# Patient Record
Sex: Female | Born: 1970 | Hispanic: No | Marital: Single | Smoking: Current every day smoker
Health system: Southern US, Community
[De-identification: ages and names within clinical notes are randomized; demographics above are authoritative.]

## PROBLEM LIST (undated history)

## (undated) DIAGNOSIS — F32A Depression, unspecified: Secondary | ICD-10-CM

## (undated) DIAGNOSIS — D259 Leiomyoma of uterus, unspecified: Secondary | ICD-10-CM

## (undated) DIAGNOSIS — F329 Major depressive disorder, single episode, unspecified: Secondary | ICD-10-CM

## (undated) DIAGNOSIS — F419 Anxiety disorder, unspecified: Secondary | ICD-10-CM

## (undated) HISTORY — PX: TUBAL LIGATION: SHX77

---
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
## 2004-08-27 ENCOUNTER — Emergency Department: Payer: Self-pay | Admitting: Emergency Medicine

## 2004-09-19 ENCOUNTER — Emergency Department: Payer: Self-pay | Admitting: Emergency Medicine

## 2004-11-12 ENCOUNTER — Emergency Department: Payer: Self-pay | Admitting: Unknown Physician Specialty

## 2005-02-20 ENCOUNTER — Emergency Department: Payer: Self-pay | Admitting: Emergency Medicine

## 2005-02-20 ENCOUNTER — Other Ambulatory Visit: Payer: Self-pay

## 2005-04-03 ENCOUNTER — Emergency Department: Payer: Self-pay | Admitting: Internal Medicine

## 2006-01-14 ENCOUNTER — Emergency Department: Payer: Self-pay | Admitting: Emergency Medicine

## 2006-05-24 ENCOUNTER — Emergency Department: Payer: Self-pay | Admitting: Emergency Medicine

## 2006-05-30 ENCOUNTER — Emergency Department: Payer: Self-pay | Admitting: Emergency Medicine

## 2006-11-20 ENCOUNTER — Emergency Department: Payer: Self-pay | Admitting: Emergency Medicine

## 2006-11-27 ENCOUNTER — Emergency Department: Payer: Self-pay | Admitting: Emergency Medicine

## 2007-05-17 ENCOUNTER — Emergency Department: Payer: Self-pay | Admitting: Emergency Medicine

## 2007-08-25 ENCOUNTER — Emergency Department: Payer: Self-pay | Admitting: Internal Medicine

## 2008-02-12 ENCOUNTER — Emergency Department: Payer: Self-pay | Admitting: Emergency Medicine

## 2008-04-19 ENCOUNTER — Emergency Department: Payer: Self-pay | Admitting: Emergency Medicine

## 2008-06-25 ENCOUNTER — Emergency Department: Payer: Self-pay | Admitting: Emergency Medicine

## 2008-09-21 ENCOUNTER — Emergency Department: Payer: Self-pay | Admitting: Emergency Medicine

## 2009-06-24 ENCOUNTER — Emergency Department: Payer: Self-pay | Admitting: Emergency Medicine

## 2009-08-16 ENCOUNTER — Emergency Department: Payer: Self-pay | Admitting: Unknown Physician Specialty

## 2010-04-03 ENCOUNTER — Emergency Department: Payer: Self-pay | Admitting: Emergency Medicine

## 2010-04-08 ENCOUNTER — Emergency Department: Payer: Self-pay | Admitting: Internal Medicine

## 2010-04-24 ENCOUNTER — Emergency Department: Payer: Self-pay | Admitting: Emergency Medicine

## 2011-07-17 ENCOUNTER — Emergency Department: Payer: Self-pay | Admitting: Emergency Medicine

## 2011-11-17 ENCOUNTER — Emergency Department: Payer: Self-pay | Admitting: Emergency Medicine

## 2014-01-11 ENCOUNTER — Emergency Department: Payer: Self-pay | Admitting: Emergency Medicine

## 2014-07-23 ENCOUNTER — Emergency Department: Payer: Self-pay | Admitting: Internal Medicine

## 2014-07-25 ENCOUNTER — Emergency Department: Payer: Self-pay | Admitting: Emergency Medicine

## 2014-07-25 LAB — DIFFERENTIAL
BASOS ABS: 0.1 10*3/uL (ref 0.0–0.1)
Basophil %: 0.5 %
Eosinophil #: 0 10*3/uL (ref 0.0–0.7)
Eosinophil %: 0.1 %
LYMPHS PCT: 13.7 %
Lymphocyte #: 2.4 10*3/uL (ref 1.0–3.6)
Monocyte #: 0.5 x10 3/mm (ref 0.2–0.9)
Monocyte %: 2.7 %
Neutrophil #: 14.6 10*3/uL — ABNORMAL HIGH (ref 1.4–6.5)
Neutrophil %: 83 %

## 2014-07-25 LAB — COMPREHENSIVE METABOLIC PANEL
ALBUMIN: 3.7 g/dL (ref 3.4–5.0)
ALK PHOS: 126 U/L — AB
ALT: 29 U/L
Anion Gap: 6 — ABNORMAL LOW (ref 7–16)
BILIRUBIN TOTAL: 0.2 mg/dL (ref 0.2–1.0)
BUN: 13 mg/dL (ref 7–18)
CREATININE: 0.87 mg/dL (ref 0.60–1.30)
Calcium, Total: 8.8 mg/dL (ref 8.5–10.1)
Chloride: 103 mmol/L (ref 98–107)
Co2: 27 mmol/L (ref 21–32)
EGFR (African American): >60
Glucose: 114 mg/dL — ABNORMAL HIGH (ref 65–99)
OSMOLALITY: 273 (ref 275–301)
Potassium: 3.7 mmol/L (ref 3.5–5.1)
SGOT(AST): 13 U/L — ABNORMAL LOW (ref 15–37)
SODIUM: 136 mmol/L (ref 136–145)
TOTAL PROTEIN: 7.7 g/dL (ref 6.4–8.2)

## 2014-07-25 LAB — CBC
HCT: 38.5 % (ref 35.0–47.0)
HGB: 12.3 g/dL (ref 12.0–16.0)
MCH: 25 pg — ABNORMAL LOW (ref 26.0–34.0)
MCHC: 31.9 g/dL — ABNORMAL LOW (ref 32.0–36.0)
MCV: 79 fL — ABNORMAL LOW (ref 80–100)
Platelet: 407 10*3/uL (ref 150–440)
RBC: 4.9 10*6/uL (ref 3.80–5.20)
RDW: 16.8 % — ABNORMAL HIGH (ref 11.5–14.5)
WBC: 17.5 10*3/uL — AB (ref 3.6–11.0)

## 2014-07-25 LAB — URINALYSIS, COMPLETE
BILIRUBIN, UR: NEGATIVE
BLOOD: NEGATIVE
Bacteria: NONE SEEN
Glucose,UR: NEGATIVE mg/dL (ref 0–75)
Ketone: NEGATIVE
Leukocyte Esterase: NEGATIVE
NITRITE: NEGATIVE
PH: 6 (ref 4.5–8.0)
Protein: NEGATIVE
RBC,UR: 3 /HPF (ref 0–5)
SPECIFIC GRAVITY: 1.01 (ref 1.003–1.030)
Squamous Epithelial: 1
WBC UR: <1 /HPF (ref 0–5)

## 2014-07-25 LAB — CK: CK, Total: 74 U/L (ref 26–192)

## 2014-10-18 ENCOUNTER — Emergency Department: Payer: Self-pay | Admitting: Emergency Medicine

## 2015-01-17 ENCOUNTER — Emergency Department
Admission: EM | Admit: 2015-01-17 | Discharge: 2015-01-17 | Disposition: A | Discharge status: Home / Self Care | Payer: Self-pay | Attending: Emergency Medicine | Admitting: Emergency Medicine

## 2015-01-17 ENCOUNTER — Encounter: Payer: Self-pay | Admitting: Emergency Medicine

## 2015-01-17 DIAGNOSIS — Z720 Tobacco use: Secondary | ICD-10-CM | POA: Insufficient documentation

## 2015-01-17 DIAGNOSIS — D509 Iron deficiency anemia, unspecified: Secondary | ICD-10-CM | POA: Insufficient documentation

## 2015-01-17 DIAGNOSIS — N939 Abnormal uterine and vaginal bleeding, unspecified: Secondary | ICD-10-CM | POA: Insufficient documentation

## 2015-01-17 HISTORY — DX: Leiomyoma of uterus, unspecified: D25.9

## 2015-01-17 LAB — BASIC METABOLIC PANEL
Anion gap: 6 (ref 5–15)
BUN: 17 mg/dL (ref 6–20)
CO2: 25 mmol/L (ref 22–32)
Calcium: 8.8 mg/dL — ABNORMAL LOW (ref 8.9–10.3)
Chloride: 107 mmol/L (ref 101–111)
Creatinine, Ser: 0.85 mg/dL (ref 0.44–1.00)
GFR calc Af Amer: >60 mL/min (ref >60)
GFR calc non Af Amer: >60 mL/min (ref >60)
Glucose, Bld: 86 mg/dL (ref 65–99)
POTASSIUM: 4 mmol/L (ref 3.5–5.1)
Sodium: 138 mmol/L (ref 135–145)

## 2015-01-17 LAB — CBC
HEMATOCRIT: 36.6 % (ref 35.0–47.0)
Hemoglobin: 11.5 g/dL — ABNORMAL LOW (ref 12.0–16.0)
MCH: 23.7 pg — ABNORMAL LOW (ref 26.0–34.0)
MCHC: 31.3 g/dL — ABNORMAL LOW (ref 32.0–36.0)
MCV: 75.6 fL — ABNORMAL LOW (ref 80.0–100.0)
PLATELETS: 373 10*3/uL (ref 150–440)
RBC: 4.84 MIL/uL (ref 3.80–5.20)
RDW: 20 % — ABNORMAL HIGH (ref 11.5–14.5)
WBC: 11.5 10*3/uL — ABNORMAL HIGH (ref 3.6–11.0)

## 2015-01-17 MED ORDER — FERROUS SULFATE 325 (65 FE) MG PO TABS: 325.0000 mg | ORAL_TABLET | Freq: Every day | ORAL | Status: DC

## 2015-01-17 NOTE — ED Notes (Signed)
Pt states her period has been coming more frequent the past few months. Pt recently got a letter from [facility] that stated she was borderline anemic and may need iron pills after further examination. C/o fatigue and slight lower abd cramping but states they are "normal menstrual like cramps". Asking for lab work to check and see if she has anemia and would like an HIV test as well. Pt alert and talkative with no increased work of breathing or acute dsitress noted at this time. Skin warm and dry.

## 2015-01-17 NOTE — Discharge Instructions (Signed)
Iron Deficiency Anemia Anemia is a condition in which there are less red blood cells or hemoglobin in the blood than normal. Hemoglobin is the part of red blood cells that carries oxygen. Iron deficiency anemia is anemia caused by too little iron. It is the most common type of anemia. It may leave you tired and short of breath. CAUSES   Lack of iron in the diet.  Poor absorption of iron, as seen with intestinal disorders.  Intestinal bleeding.  Heavy periods. SIGNS AND SYMPTOMS  Mild anemia may not be noticeable. Symptoms may include:  Fatigue.  Headache.  Pale skin.  Weakness.  Tiredness.  Shortness of breath.  Dizziness.  Cold hands and feet.  Fast or irregular heartbeat. DIAGNOSIS  Diagnosis requires a thorough evaluation and physical exam by your health care provider. Blood tests are generally used to confirm iron deficiency anemia. Additional tests may be done to find the underlying cause of your anemia. These may include:  Testing for blood in the stool (fecal occult blood test).  A procedure to see inside the colon and rectum (colonoscopy).  A procedure to see inside the esophagus and stomach (endoscopy). TREATMENT  Iron deficiency anemia is treated by correcting the cause of the deficiency. Treatment may involve:  Adding iron-rich foods to your diet.  Taking iron supplements. Pregnant or breastfeeding women need to take extra iron because their normal diet usually does not provide the required amount.  Taking vitamins. Vitamin C improves the absorption of iron. Your health care provider may recommend that you take your iron tablets with a glass of orange juice or vitamin C supplement.  Medicines to make heavy menstrual flow lighter.  Surgery. HOME CARE INSTRUCTIONS   Take iron as directed by your health care provider.  If you cannot tolerate taking iron supplements by mouth, talk to your health care provider about taking them through a vein  (intravenously) or an injection into a muscle.  For the best iron absorption, iron supplements should be taken on an empty stomach. If you cannot tolerate them on an empty stomach, you may need to take them with food.  Do not drink milk or take antacids at the same time as your iron supplements. Milk and antacids may interfere with the absorption of iron.  Iron supplements can cause constipation. Make sure to include fiber in your diet to prevent constipation. A stool softener may also be recommended.  Take vitamins as directed by your health care provider.  Eat a diet rich in iron. Foods high in iron include liver, lean beef, whole-grain bread, eggs, dried fruit, and dark green leafy vegetables. SEEK IMMEDIATE MEDICAL CARE IF:   You faint. If this happens, do not drive. Call your local emergency services (911 in [place] if no other help is available.  You have chest pain.  You feel nauseous or vomit.  You have severe or increased shortness of breath with activity.  You feel weak.  You have a rapid heartbeat.  You have unexplained sweating.  You become light-headed when getting up from a chair or bed. MAKE SURE YOU:   Understand these instructions.  Will watch your condition.  Will get help right away if you are not doing well or get worse. Document Released: 07/26/2000 Document Revised: 08/03/2013 Document Reviewed: 04/05/2013 [employer] Patient Information 2015 [employer]. This information is not intended to replace advice given to you by your health care provider. Make sure you discuss any questions you have with your health care provider.  

## 2015-01-17 NOTE — ED Notes (Signed)
Patient with no complaints at this time. Respirations even and unlabored. Skin warm/dry. Discharge instructions reviewed with patient at this time. Patient given opportunity to voice concerns/ask questions. Patient discharged at this time and left Emergency Department with steady gait.   

## 2015-01-17 NOTE — ED Provider Notes (Signed)
[facility] Emergency Department Provider Note  ____________________________________________  Time seen: 9:30 PM  I have reviewed the triage vital signs and the nursing notes.   HISTORY  Chief Complaint Anemia and Vaginal Bleeding      HPI Jennifer Wiggins is a 44 y.o. female who presents with complaints of possible anemia. Patient reports she was told by her PCP that her blood counts demonstrated that she had borderline anemia and that she may need iron pills. She has a history of fibroids and has had intermittent bleeding over the last couple of years although it has not been severe. She does report some mild vaginal bleeding currently. She has T tied. She has no dizziness no weakness no fatigue. She requests hemoglobin check and iron pills     Past Medical History  Diagnosis Date  . Uterine fibroid     There are no active problems to display for this patient.   Past Surgical History  Procedure Laterality Date  . Cesarean section    . Tubal ligation      No current outpatient prescriptions on file.  Allergies Review of patient's allergies indicates no known allergies.  No family history on file.  Social History History  Substance Use Topics  . Smoking status: Current Every Day Smoker -- 0.50 packs/day    Types: Cigarettes  . Smokeless tobacco: Not on file  . Alcohol Use: No    Review of Systems  Constitutional: Negative for fever. Eyes: Negative for visual changes. ENT: Negative for sore throat Cardiovascular: Negative for chest pain. Respiratory: Negative for shortness of breath. Gastrointestinal: Negative for abdominal pain, vomiting and diarrhea. Genitourinary: Negative for dysuria. Positive for vaginal bleeding Musculoskeletal: Negative for back pain. Skin: Negative for rash. Neurological: Negative for headaches or focal weakness   10-point ROS otherwise negative.  ____________________________________________   PHYSICAL  EXAM:  VITAL SIGNS: ED Triage Vitals  Enc Vitals Group     BP 01/17/15 1948 128/80 mmHg     Pulse Rate 01/17/15 1948 64     Resp 01/17/15 1948 18     Temp 01/17/15 1948 98.1 F (36.7 C)     Temp Source 01/17/15 1948 Oral     SpO2 01/17/15 1948 100 %     Weight 01/17/15 1948 185 lb (83.915 kg)     Height 01/17/15 1948 5\' 5"  (1.651 m)     Head Cir --      Peak Flow --      Pain Score 01/17/15 1949 3     Pain Loc --      Pain Edu? --      Excl. in [place]? --      Constitutional: Alert and oriented. Well appearing and in no distress. Eyes: Conjunctivae are normal. PERRL. ENT   Head: Normocephalic and atraumatic.   Nose: No rhinnorhea.   Mouth/Throat: Mucous membranes are moist. Cardiovascular: Normal rate, regular rhythm. Normal and symmetric distal pulses are present in all extremities. No murmurs, rubs, or gallops. Respiratory: Normal respiratory effort without tachypnea nor retractions. Breath sounds are clear and equal bilaterally.  Gastrointestinal: Soft and non-tender in all quadrants. No distention. There is no CVA tenderness. Genitourinary: deferred Musculoskeletal: Nontender with normal range of motion in all extremities. No lower extremity tenderness nor edema. Neurologic:  Normal speech and language. No gross focal neurologic deficits are appreciated. Skin:  Skin is warm, dry and intact. No rash noted. Psychiatric: Mood and affect are normal. Patient exhibits appropriate insight and judgment.  ____________________________________________    LABS (pertinent positives/negatives)  Labs Reviewed  CBC - Abnormal; Notable for the following:    WBC 11.5 (*)    Hemoglobin 11.5 (*)    MCV 75.6 (*)    MCH 23.7 (*)    MCHC 31.3 (*)    RDW 20.0 (*)    All other components within normal limits  BASIC METABOLIC PANEL - Abnormal; Notable for the following:    Calcium 8.8 (*)    All other components within normal limits  URINALYSIS COMPLETEWITH MICROSCOPIC ([facility]  ONLY)  PREGNANCY, URINE    ____________________________________________   EKG  None  ____________________________________________    RADIOLOGY  None  ____________________________________________   PROCEDURES  Procedure(s) performed: none  Critical Care performed: none  ____________________________________________   INITIAL IMPRESSION / ASSESSMENT AND PLAN / ED COURSE  Pertinent labs & imaging results that were available during my care of the patient were reviewed by me and considered in my medical decision making (see chart for details).  Patient well-appearing. All vitals stable. No abdominal tenderness. Hemoglobin stable she does have a microcytic anemia that is mild. She requests iron pills I will prescribe him. Okay for discharge with PCP follow-up  ____________________________________________   FINAL CLINICAL IMPRESSION(S) / ED DIAGNOSES  Final diagnoses:  Microcytic anemia     Lavonia Drafts, MD 01/17/15 2140

## 2015-03-24 ENCOUNTER — Emergency Department
Admission: EM | Admit: 2015-03-24 | Discharge: 2015-03-24 | Disposition: A | Discharge status: Home / Self Care | Payer: Self-pay | Attending: Emergency Medicine | Admitting: Emergency Medicine

## 2015-03-24 ENCOUNTER — Encounter: Payer: Self-pay | Admitting: Emergency Medicine

## 2015-03-24 DIAGNOSIS — L237 Allergic contact dermatitis due to plants, except food: Secondary | ICD-10-CM | POA: Insufficient documentation

## 2015-03-24 DIAGNOSIS — Z720 Tobacco use: Secondary | ICD-10-CM | POA: Insufficient documentation

## 2015-03-24 DIAGNOSIS — Z79899 Other long term (current) drug therapy: Secondary | ICD-10-CM | POA: Insufficient documentation

## 2015-03-24 DIAGNOSIS — J069 Acute upper respiratory infection, unspecified: Secondary | ICD-10-CM | POA: Insufficient documentation

## 2015-03-24 DIAGNOSIS — Z793 Long term (current) use of hormonal contraceptives: Secondary | ICD-10-CM | POA: Insufficient documentation

## 2015-03-24 LAB — POCT RAPID STREP A: STREPTOCOCCUS, GROUP A SCREEN (DIRECT): NEGATIVE

## 2015-03-24 MED ORDER — TRIAMCINOLONE ACETONIDE 0.5 % EX OINT: 1.0000 "application " | TOPICAL_OINTMENT | Freq: Two times a day (BID) | CUTANEOUS | Status: DC

## 2015-03-24 NOTE — ED Notes (Signed)
Pt to ed with c/o sore throat, cough, congestion x 3 days.

## 2015-03-24 NOTE — Discharge Instructions (Signed)
Upper Respiratory Infection, Adult An upper respiratory infection (URI) is also known as the common cold. It is often caused by a type of germ (virus). Colds are easily spread (contagious). You can pass it to others by kissing, coughing, sneezing, or drinking out of the same glass. Usually, you get better in 1 or 2 weeks.  HOME CARE   Only take medicine as told by your doctor.  Use a warm mist humidifier or breathe in steam from a hot shower.  Drink enough water and fluids to keep your pee (urine) clear or pale yellow.  Get plenty of rest.  Return to work when your temperature is back to normal or as told by your doctor. You may use a face mask and wash your hands to stop your cold from spreading. GET HELP RIGHT AWAY IF:   After the first few days, you feel you are getting worse.  You have questions about your medicine.  You have chills, shortness of breath, or brown or red spit (mucus).  You have yellow or brown snot (nasal discharge) or pain in the face, especially when you bend forward.  You have a fever, puffy (swollen) neck, pain when you swallow, or white spots in the back of your throat.  You have a bad headache, ear pain, sinus pain, or chest pain.  You have a high-pitched whistling sound when you breathe in and out (wheezing).  You have a lasting cough or cough up blood.  You have sore muscles or a stiff neck. MAKE SURE YOU:   Understand these instructions.  Will watch your condition.  Will get help right away if you are not doing well or get worse. Document Released: 01/15/2008 Document Revised: 10/21/2011 Document Reviewed: 11/03/2013 [facility] Patient Information 2015 [place], [place]. This information is not intended to replace advice given to you by your health care provider. Make sure you discuss any questions you have with your health care provider.  Poison [employer] ivy is a rash caused by touching the leaves of the poison ivy plant. The rash often shows up  48 hours later. You might just have bumps, redness, and itching. Sometimes, blisters appear and break open. Your eyes may get puffy (swollen). Poison ivy often heals in 2 to 3 weeks without treatment. HOME CARE  If you touch poison ivy:  Wash your skin with soap and water right away. Wash under your fingernails. Do not rub the skin very hard.  Wash any clothes you were wearing.  Avoid poison ivy in the future. Poison ivy has 3 leaves on a stem.  Use medicine to help with itching as told by your doctor. Do not drive when you take this medicine.  Keep open sores dry, clean, and covered with a bandage and medicated cream, if needed.  Ask your doctor about medicine for children. GET HELP RIGHT AWAY IF:  You have open sores.  Redness spreads beyond the area of the rash.  There is yellowish white fluid (pus) coming from the rash.  Pain gets worse.  You have a temperature by mouth above 102 F (38.9 C), not controlled by medicine. MAKE SURE YOU:  Understand these instructions.  Will watch your condition.  Will get help right away if you are not doing well or get worse. Document Released: 08/31/2010 Document Revised: 10/21/2011 Document Reviewed: 08/31/2010 [facility] Patient Information 2015 [place], [place]. This information is not intended to replace advice given to you by your health care provider. Make sure you discuss any questions  you have with your health care provider.

## 2015-03-24 NOTE — ED Provider Notes (Signed)
[facility] Emergency Department Provider Note ____________________________________________  Time seen: Approximately 7:25 AM  I have reviewed the triage vital signs and the nursing notes.   HISTORY  Chief Complaint Sore Throat   HPI Jennifer Wiggins is a 44 y.o. female presenting to the emergency department with complaints of sore throat, cough, and congestion for 3 days. She is still able to eat and drink normally.  She states that it is more that her throat itches and is irritated more so than painful. Denies fever, body aches, N/V. She says she has been taking her "sinus medication" but did not know the name.Patient also complaining of poison ivy rash on her right forearm.  She poured bleach on it at home and it is burning and stinging now.   Past Medical History  Diagnosis Date  . Uterine fibroid     There are no active problems to display for this patient.   Past Surgical History  Procedure Laterality Date  . Cesarean section    . Tubal ligation      Current Outpatient Rx  Name  Route  Sig  Dispense  Refill  . fluticasone (FLONASE) 50 MCG/ACT nasal spray   Each Nare   Place 2 sprays into both nostrils daily.         . sertraline (ZOLOFT) 25 MG tablet   Oral   Take 25 mg by mouth daily.         . ferrous sulfate 325 (65 FE) MG tablet   Oral   Take 1 tablet (325 mg total) by mouth daily.   30 tablet   3   . triamcinolone ointment (KENALOG) 0.5 %   Topical   Apply 1 application topically 2 (two) times daily.   30 g   0     Allergies Review of patient's allergies indicates no known allergies.  History reviewed. No pertinent family history.  Social History Social History  Substance Use Topics  . Smoking status: Current Every Day Smoker -- 0.50 packs/day    Types: Cigarettes  . Smokeless tobacco: None  . Alcohol Use: No    Review of Systems Constitutional: No fever/chills Eyes: No visual changes. ENT: Positive sore  throat. Cardiovascular: Denies chest pain. Respiratory: Denies shortness of breath. Gastrointestinal: No abdominal pain.  No nausea, no vomiting.  No diarrhea.  No constipation. Genitourinary: Negative for dysuria. Musculoskeletal: Negative for back pain. Skin: Positive for poison ivy rash on right forearm. Neurological: Negative for headaches, focal weakness or numbness.  10-point ROS otherwise negative.  ____________________________________________   PHYSICAL EXAM:  VITAL SIGNS: ED Triage Vitals  Enc Vitals Group     BP 03/24/15 0712 115/68 mmHg     Pulse Rate 03/24/15 0712 65     Resp 03/24/15 0712 18     Temp 03/24/15 0712 98 F (36.7 C)     Temp Source 03/24/15 0712 Oral     SpO2 03/24/15 0712 98 %     Weight 03/24/15 0712 185 lb (83.915 kg)     Height 03/24/15 0712 5\' 5"  (1.651 m)     Head Cir --      Peak Flow --      Pain Score 03/24/15 0716 6     Pain Loc --      Pain Edu? --      Excl. in [place]? --     Constitutional: Alert and oriented. Well appearing and in no acute distress. Eyes: Conjunctivae are normal. PERRL. EOMI. Head:  Atraumatic. Nose: Mild congestion/rhinnorhea. Mouth/Throat: Mucous membranes are moist.  Oropharynx mildly erythematous.  No exudates noted. Neck: No stridor.   Hematological/Lymphatic/Immunilogical: No cervical lymphadenopathy. Cardiovascular: Normal rate, regular rhythm. Grossly normal heart sounds.  Good peripheral circulation. Respiratory: Normal respiratory effort.  No retractions. Lungs CTAB. Gastrointestinal: Soft and nontender. No distention. No abdominal bruits. No CVA tenderness. Musculoskeletal: No lower extremity tenderness nor edema.  No joint effusions. Neurologic:  Normal speech and language. No gross focal neurologic deficits are appreciated. No gait instability. Skin:  Rash noted on the right forearm. Psychiatric: Mood and affect are normal. Speech and behavior are normal.  ____________________________________________    LABS (all labs ordered are listed, but only abnormal results are displayed)  Labs Reviewed  CULTURE, GROUP A STREP ([facility] ONLY)  POCT RAPID STREP A     Rapid Strep Antigen - Negative ____________________________________________   PROCEDURES  Procedure(s) performed: None  Critical Care performed: No  ____________________________________________   INITIAL IMPRESSION / ASSESSMENT AND PLAN / ED COURSE  Pertinent labs & imaging results that were available during my care of the patient were reviewed by me and considered in my medical decision making (see chart for details).  Patient presented to the emergency room for complaint of sore throat, cough and congestion.  Rapid strep antigen negative.  Patient counseled on viral URI symptoms and supportive treatment.  Poison Ivy rash noted on the right arm and Triamcinolone cream Rx provided. ____________________________________________   FINAL CLINICAL IMPRESSION(S) / ED DIAGNOSES  Final diagnoses:  URI, acute  Poison ivy dermatitis     Arlyss Repress, PA-C 03/24/15 0818  Delman Kitten, MD 03/24/15 1553

## 2015-03-26 LAB — CULTURE, GROUP A STREP (THRC)

## 2015-05-12 ENCOUNTER — Emergency Department
Admission: EM | Admit: 2015-05-12 | Discharge: 2015-05-12 | Disposition: A | Discharge status: Home / Self Care | Payer: Self-pay | Attending: Emergency Medicine | Admitting: Emergency Medicine

## 2015-05-12 DIAGNOSIS — Z79899 Other long term (current) drug therapy: Secondary | ICD-10-CM | POA: Insufficient documentation

## 2015-05-12 DIAGNOSIS — L03116 Cellulitis of left lower limb: Secondary | ICD-10-CM | POA: Insufficient documentation

## 2015-05-12 DIAGNOSIS — Z720 Tobacco use: Secondary | ICD-10-CM | POA: Insufficient documentation

## 2015-05-12 MED ORDER — CEPHALEXIN 500 MG PO CAPS: 500.0000 mg | ORAL_CAPSULE | Freq: Two times a day (BID) | ORAL | Status: DC

## 2015-05-12 MED ORDER — CEPHALEXIN 500 MG PO CAPS
500.0000 mg | ORAL_CAPSULE | Freq: Once | ORAL | Status: AC
Start: 2015-05-12 — End: 2015-05-12
  Administered 2015-05-12: 500 mg via ORAL
  Filled 2015-05-12: qty 1

## 2015-05-12 NOTE — Discharge Instructions (Signed)

## 2015-05-12 NOTE — ED Notes (Signed)
Pt with red areas to lower legs unsure of cause.

## 2015-05-12 NOTE — ED Provider Notes (Signed)
[facility] Emergency Department Provider Note  ____________________________________________  Time seen: 2:15 a.m.  I have reviewed the triage vital signs and the nursing notes.   HISTORY  Chief Complaint Rash      HPI ARDEL JAGGER is a 44 y.o. female presents with "red areas noted on her legs times one day". Patient denies any pain no fever no nausea no vomiting. Patient does not recall any insect bites.     Past Medical History  Diagnosis Date  . Uterine fibroid     There are no active problems to display for this patient.   Past Surgical History  Procedure Laterality Date  . Cesarean section    . Tubal ligation      Current Outpatient Rx  Name  Route  Sig  Dispense  Refill  . cephALEXin (KEFLEX) 500 MG capsule   Oral   Take 1 capsule (500 mg total) by mouth 2 (two) times daily.   14 capsule   0   . ferrous sulfate 325 (65 FE) MG tablet   Oral   Take 1 tablet (325 mg total) by mouth daily.   30 tablet   3   . fluticasone (FLONASE) 50 MCG/ACT nasal spray   Each Nare   Place 2 sprays into both nostrils daily.         . sertraline (ZOLOFT) 25 MG tablet   Oral   Take 25 mg by mouth daily.         Marland Kitchen triamcinolone ointment (KENALOG) 0.5 %   Topical   Apply 1 application topically 2 (two) times daily.   30 g   0     Allergies Review of patient's allergies indicates no known allergies.  No family history on file.  Social History Social History  Substance Use Topics  . Smoking status: Current Every Day Smoker -- 0.50 packs/day    Types: Cigarettes  . Smokeless tobacco: Not on file  . Alcohol Use: No    Review of Systems  Constitutional: Negative for fever. Eyes: Negative for visual changes. ENT: Negative for sore throat. Cardiovascular: Negative for chest pain. Respiratory: Negative for shortness of breath. Gastrointestinal: Negative for abdominal pain, vomiting and diarrhea. Genitourinary: Negative for  dysuria. Musculoskeletal: Negative for back pain. Skin: Positive for rash. Neurological: Negative for headaches, focal weakness or numbness.   10-point ROS otherwise negative.  ____________________________________________   PHYSICAL EXAM:  VITAL SIGNS: ED Triage Vitals  Enc Vitals Group     BP 05/12/15 0148 112/58 mmHg     Pulse Rate 05/12/15 0148 92     Resp 05/12/15 0148 18     Temp 05/12/15 0148 98.2 F (36.8 C)     Temp Source 05/12/15 0148 Oral     SpO2 05/12/15 0148 100 %     Weight 05/12/15 0146 185 lb (83.915 kg)     Height 05/12/15 0146 5\' 5"  (1.651 m)     Head Cir --      Peak Flow --      Pain Score --      Pain Loc --      Pain Edu? --      Excl. in [place]? --     Constitutional: Alert and oriented. Well appearing and in no distress. Eyes: Conjunctivae are normal. PERRL. Normal extraocular movements. ENT   Head: Normocephalic and atraumatic.   Nose: No congestion/rhinnorhea.   Mouth/Throat: Mucous membranes are moist.   Neck: No stridor. Hematological/Lymphatic/Immunilogical: No cervical lymphadenopathy. Cardiovascular:  Normal rate, regular rhythm. Normal and symmetric distal pulses are present in all extremities. No murmurs, rubs, or gallops. Respiratory: Normal respiratory effort without tachypnea nor retractions. Breath sounds are clear and equal bilaterally. No wheezes/rales/rhonchi. Gastrointestinal: Soft and nontender. No distention. There is no CVA tenderness. Genitourinary: deferred Musculoskeletal: Nontender with normal range of motion in all extremities. No joint effusions.  No lower extremity tenderness nor edema. Neurologic:  Normal speech and language. No gross focal neurologic deficits are appreciated. Speech is normal.  Skin: Distinct area of erythema noted on the patient's left lower extremity that is blanching. Psychiatric: Mood and affect are normal. Speech and behavior are normal. Patient exhibits appropriate insight and  judgment.       INITIAL IMPRESSION / ASSESSMENT AND PLAN / ED COURSE  Pertinent labs & imaging results that were available during my care of the patient were reviewed by me and considered in my medical decision making (see chart for details).    ____________________________________________   FINAL CLINICAL IMPRESSION(S) / ED DIAGNOSES  Final diagnoses:  Cellulitis of left leg      Gregor Hams, MD 05/12/15 [phone]

## 2015-05-12 NOTE — ED Notes (Signed)
MD Brown at bedside.

## 2015-06-06 ENCOUNTER — Emergency Department
Admission: EM | Admit: 2015-06-06 | Discharge: 2015-06-06 | Disposition: A | Discharge status: Home / Self Care | Payer: Self-pay | Attending: Student | Admitting: Student

## 2015-06-06 ENCOUNTER — Encounter: Payer: Self-pay | Admitting: *Deleted

## 2015-06-06 DIAGNOSIS — H6991 Unspecified Eustachian tube disorder, right ear: Secondary | ICD-10-CM | POA: Insufficient documentation

## 2015-06-06 DIAGNOSIS — Z720 Tobacco use: Secondary | ICD-10-CM | POA: Insufficient documentation

## 2015-06-06 DIAGNOSIS — Z792 Long term (current) use of antibiotics: Secondary | ICD-10-CM | POA: Insufficient documentation

## 2015-06-06 DIAGNOSIS — Z79899 Other long term (current) drug therapy: Secondary | ICD-10-CM | POA: Insufficient documentation

## 2015-06-06 DIAGNOSIS — Z7951 Long term (current) use of inhaled steroids: Secondary | ICD-10-CM | POA: Insufficient documentation

## 2015-06-06 DIAGNOSIS — R51 Headache: Secondary | ICD-10-CM | POA: Insufficient documentation

## 2015-06-06 DIAGNOSIS — H6981 Other specified disorders of Eustachian tube, right ear: Secondary | ICD-10-CM

## 2015-06-06 MED ORDER — FEXOFENADINE-PSEUDOEPHED ER 60-120 MG PO TB12: 1.0000 | ORAL_TABLET | Freq: Two times a day (BID) | ORAL | Status: DC

## 2015-06-06 MED ORDER — NAPROXEN 500 MG PO TABS: 500.0000 mg | ORAL_TABLET | Freq: Two times a day (BID) | ORAL | Status: DC

## 2015-06-06 NOTE — ED Notes (Signed)
Pt reports right ear ache and congestion starting yesterday

## 2015-06-06 NOTE — ED Provider Notes (Signed)
[facility] Emergency Department Provider Note  ____________________________________________  Time seen: Approximately 3:12 PM  I have reviewed the triage vital signs and the nursing notes.   HISTORY  Chief Complaint Otalgia    HPI Jennifer Wiggins is a 44 y.o. female who presents with right ear pain for one day. Her right ear began to hurt yesterday while at work, and now her left ear is starting to feel full. It is a sharp, 7/10 pain, constant. Ibuprofen helps some; nothing has made it worse. She regularly takes Flonase. She uses Q tips to clean her ears. She also has a slight headache and some sneezing. She denies ear drainage, change in hearing, change in vision, feeling dizzy or lightheaded, cough, sore throat, CP, SOB.  She smokes 10 cigarettes a day and works second shift at a [employer].   Past Medical History  Diagnosis Date  . Uterine fibroid     There are no active problems to display for this patient.   Past Surgical History  Procedure Laterality Date  . Cesarean section    . Tubal ligation      Current Outpatient Rx  Name  Route  Sig  Dispense  Refill  . cephALEXin (KEFLEX) 500 MG capsule   Oral   Take 1 capsule (500 mg total) by mouth 2 (two) times daily.   14 capsule   0   . ferrous sulfate 325 (65 FE) MG tablet   Oral   Take 1 tablet (325 mg total) by mouth daily.   30 tablet   3   . fluticasone (FLONASE) 50 MCG/ACT nasal spray   Each Nare   Place 2 sprays into both nostrils daily.         . sertraline (ZOLOFT) 25 MG tablet   Oral   Take 25 mg by mouth daily.         Marland Kitchen triamcinolone ointment (KENALOG) 0.5 %   Topical   Apply 1 application topically 2 (two) times daily.   30 g   0     Allergies Review of patient's allergies indicates no known allergies.  No family history on file.  Social History Social History  Substance Use Topics  . Smoking status: Current Every Day Smoker -- 0.50 packs/day    Types:  Cigarettes  . Smokeless tobacco: None  . Alcohol Use: No    Review of Systems Constitutional: No fever/chills Eyes: No visual changes. ENT: No sore throat, no ear drainage or hearing change. Cardiovascular: Denies chest pain. Respiratory: Denies shortness of breath. Denies cough. Gastrointestinal: No abdominal pain.  No nausea, no vomiting.   Neurological: Positive for mild headache; Negative for focal weakness or numbness.  10-point ROS otherwise negative.  ____________________________________________   PHYSICAL EXAM:  VITAL SIGNS: ED Triage Vitals  Enc Vitals Group     BP 06/06/15 1446 136/59 mmHg     Pulse Rate 06/06/15 1446 73     Resp 06/06/15 1446 20     Temp 06/06/15 1446 98.2 F (36.8 C)     Temp Source 06/06/15 1446 Oral     SpO2 06/06/15 1446 98 %     Weight 06/06/15 1446 180 lb (81.647 kg)     Height 06/06/15 1446 5\' 5"  (1.651 m)     Head Cir --      Peak Flow --      Pain Score 06/06/15 1447 6     Pain Loc --      Pain Edu? --  Excl. in [place]? --     Constitutional: Alert and oriented. Well appearing and in no acute distress. Eyes: Conjunctivae are normal.  Head: Atraumatic. Ears: TM visible and gray, cone of light visible, no perforation; fluid bubbles visible behind both TMs. Nose: No congestion/rhinnorhea. Mild sinus tenderness. Mouth/Throat: Mucous membranes are moist.  Oropharynx non-erythematous. Neck: No stridor.   Hematological/Lymphatic/Immunilogical: No cervical lymphadenopathy. Cardiovascular: Normal rate, regular rhythm. Grossly normal heart sounds.  Good peripheral circulation. Respiratory: Normal respiratory effort.  No retractions. Lungs CTAB. Gastrointestinal: Soft and nontender. No distention. Bowel sounds present. Neurologic:  Normal speech and language. No gross focal neurologic deficits are appreciated. No gait instability. Skin:  Skin is warm, dry and intact. No rash noted. Psychiatric: Mood and affect are somewhat flat. Speech  and behavior are normal.  ____________________________________________   LABS (all labs ordered are listed, but only abnormal results are displayed)  Labs Reviewed - No data to display ____________________________________________  EKG  None ____________________________________________  RADIOLOGY  None ____________________________________________   PROCEDURES  Procedure(s) performed: None  Critical Care performed: No  ____________________________________________   INITIAL IMPRESSION / ASSESSMENT AND PLAN / ED COURSE  Pertinent labs & imaging results that were available during my care of the patient were reviewed by me and considered in my medical decision making (see chart for details).  Eustachian tube dysfunction. Will Rx Allegra-D and Naproxen. Have her continue her regular Flonase. Follow up with [facility] if condition persists. ____________________________________________   FINAL CLINICAL IMPRESSION(S) / ED DIAGNOSES  Final diagnoses:  Eustachian tube dysfunction, right     Sable Feil, PA-C 06/06/15 1519  Joanne Gavel, MD 06/06/15 [phone]

## 2015-12-21 ENCOUNTER — Emergency Department
Admission: EM | Admit: 2015-12-21 | Discharge: 2015-12-21 | Disposition: A | Discharge status: Home / Self Care | Payer: Self-pay | Attending: Emergency Medicine | Admitting: Emergency Medicine

## 2015-12-21 ENCOUNTER — Emergency Department: Payer: Self-pay

## 2015-12-21 ENCOUNTER — Encounter: Payer: Self-pay | Admitting: *Deleted

## 2015-12-21 DIAGNOSIS — W1839XA Other fall on same level, initial encounter: Secondary | ICD-10-CM | POA: Insufficient documentation

## 2015-12-21 DIAGNOSIS — Y929 Unspecified place or not applicable: Secondary | ICD-10-CM | POA: Insufficient documentation

## 2015-12-21 DIAGNOSIS — Y939 Activity, unspecified: Secondary | ICD-10-CM | POA: Insufficient documentation

## 2015-12-21 DIAGNOSIS — Z79899 Other long term (current) drug therapy: Secondary | ICD-10-CM | POA: Insufficient documentation

## 2015-12-21 DIAGNOSIS — Y999 Unspecified external cause status: Secondary | ICD-10-CM | POA: Insufficient documentation

## 2015-12-21 DIAGNOSIS — S5002XA Contusion of left elbow, initial encounter: Secondary | ICD-10-CM | POA: Insufficient documentation

## 2015-12-21 DIAGNOSIS — F17210 Nicotine dependence, cigarettes, uncomplicated: Secondary | ICD-10-CM | POA: Insufficient documentation

## 2015-12-21 MED ORDER — IBUPROFEN 800 MG PO TABS: 800.0000 mg | ORAL_TABLET | Freq: Three times a day (TID) | ORAL | Status: DC

## 2015-12-21 NOTE — ED Provider Notes (Signed)
[facility] Department Provider Note   ____________________________________________  Time seen: Approximately 10:44 AM  I have reviewed the triage vital signs and the nursing notes.   HISTORY  Chief Complaint Arm Injury   HPI Jennifer Wiggins is a 45 y.o. female this is a complaint of left elbow pain. Patient states that she fell last night due to her shoes being wet. She has continued to have some pain and mild swelling to her elbow. Range of motion has increased her pain. Patient has not taken any over-the-counter medication for her pain. She denies any previous fractures in this Jennifer area. She denies any head injury or loss of consciousness last night. Currently patient rates her pain as a 6/10.   Past Medical History  Diagnosis Date  . Uterine fibroid     There are no active problems to display for this patient.   Past Surgical History  Procedure Laterality Date  . Cesarean section    . Tubal ligation      Current Outpatient Rx  Name  Route  Sig  Dispense  Refill  . fluticasone (FLONASE) 50 MCG/ACT nasal spray   Each Nare   Place 2 sprays into both nostrils daily.         Marland Kitchen ibuprofen (ADVIL,MOTRIN) 800 MG tablet   Oral   Take 1 tablet (800 mg total) by mouth 3 (three) times daily.   30 tablet   0   . sertraline (ZOLOFT) 25 MG tablet   Oral   Take 25 mg by mouth daily.           Allergies Review of patient's allergies indicates no known allergies.  No family history on file.  Social History Social History  Substance Use Topics  . Smoking status: Current Every Day Smoker -- 0.50 packs/day    Types: Cigarettes  . Smokeless tobacco: None  . Alcohol Use: No    Review of Systems Constitutional: No fever/chills Eyes: No visual changes. ENT: No trauma Cardiovascular: Denies chest pain. Respiratory: Denies shortness of breath. Gastrointestinal:   No nausea, no vomiting.   Musculoskeletal: Negative for back pain.  Positive left elbow pain. Skin: Negative for rash. Neurological: Negative for headaches, focal weakness or numbness.  10-point ROS otherwise negative.  ____________________________________________   PHYSICAL EXAM:  VITAL SIGNS: ED Triage Vitals  Enc Vitals Group     BP 12/21/15 0926 112/59 mmHg     Pulse Rate 12/21/15 0926 73     Resp 12/21/15 0926 16     Temp 12/21/15 0926 98.2 F (36.8 C)     Temp Source 12/21/15 0926 Oral     SpO2 12/21/15 0926 97 %     Weight 12/21/15 0926 180 lb (81.647 kg)     Height 12/21/15 0926 5\' 4"  (1.626 m)     Head Cir --      Peak Flow --      Pain Score 12/21/15 0923 6     Pain Loc --      Pain Edu? --      Excl. in [place]? --     Constitutional: Alert and oriented. Well appearing and in no acute distress. Eyes: Conjunctivae are normal. PERRL. EOMI. Head: Atraumatic. Nose: No congestion/rhinnorhea. Neck: No stridor.  No cervical tenderness on palpation posteriorly. Cardiovascular: Normal rate, regular rhythm. Grossly normal heart sounds.  Good peripheral circulation. Respiratory: Normal respiratory effort.  No retractions. Lungs CTAB. Musculoskeletal: Examination of the left elbow there is no gross  deformity. There is some tenderness on palpation posteriorly and lateral epicondyle lateral proximal aspect. Range of motion is slow secondary to pain. Patient is able to flex and extend and rotate but slowly secondary to pain. Minimal soft tissue swelling is noted. Neurologic:  Normal speech and language. No gross focal neurologic deficits are appreciated. No gait instability. Skin:  Skin is warm, dry and intact. Minimal 2 cm area of ecchymosis but no abrasions were seen. Psychiatric: Mood and affect are normal. Speech and behavior are normal.  ____________________________________________   LABS (all labs ordered are listed, but only abnormal results are displayed)  Labs Reviewed - No data to  display ____________________________________________  EKG  Deferred ____________________________________________  RADIOLOGY  X-ray of left elbow is negative for fracture per radiologist. Leana Gamer, personally viewed and evaluated these images (plain radiographs) as part of my medical decision making, as well as reviewing the written report by the radiologist. ____________________________________________   PROCEDURES  Procedure(s) performed: None  Critical Care performed: No  ____________________________________________   INITIAL IMPRESSION / ASSESSMENT AND PLAN / ED COURSE  Pertinent labs & imaging results that were available during my care of the patient were reviewed by me and considered in my medical decision making (see chart for details).  Patient is given a prescription for ibuprofen 800 mg 3 times a day with food. She is to ice and elevate as needed for pain and swelling. She is to follow-up with Dr. Sabra Heck if any continued positive with her elbow. ____________________________________________   FINAL CLINICAL IMPRESSION(S) / ED DIAGNOSES  Final diagnoses:  Contusion, elbow, left, initial encounter      NEW MEDICATIONS STARTED DURING THIS VISIT:  Discharge Medication List as of 12/21/2015 10:54 AM    START taking these medications   Details  ibuprofen (ADVIL,MOTRIN) 800 MG tablet Take 1 tablet (800 mg total) by mouth 3 (three) times daily., Starting 12/21/2015, Until Discontinued, Print         Note:  This document was prepared using Dragon voice recognition software and may include unintentional dictation errors.    Johnn Hai, PA-C 12/21/15 1556  Daymon Larsen, MD 12/22/15 [phone]

## 2015-12-21 NOTE — ED Notes (Signed)
Pt fell last night landed on left arm, pt complains of left arm pain, no obvious deformity

## 2015-12-21 NOTE — Discharge Instructions (Signed)
Cryotherapy Cryotherapy is when you put ice on your injury. Ice helps lessen pain and puffiness (swelling) after an injury. Ice works the best when you start using it in the first 24 to 48 hours after an injury. HOME CARE  Put a dry or damp towel between the ice pack and your skin.  You may press gently on the ice pack.  Leave the ice on for no more than 10 to 20 minutes at a time.  Check your skin after 5 minutes to make sure your skin is okay.  Rest at least 20 minutes between ice pack uses.  Stop using ice when your skin loses feeling (numbness).  Do not use ice on someone who cannot tell you when it hurts. This includes small children and people with memory problems (dementia). GET HELP RIGHT AWAY IF:  You have white spots on your skin.  Your skin turns blue or pale.  Your skin feels waxy or hard.  Your puffiness gets worse. MAKE SURE YOU:   Understand these instructions.  Will watch your condition.  Will get help right away if you are not doing well or get worse.   This information is not intended to replace advice given to you by your health care provider. Make sure you discuss any questions you have with your health care provider.   Document Released: 01/15/2008 Document Revised: 10/21/2011 Document Reviewed: 03/21/2011 Elsevier Interactive Patient Education 2016 [place].  Elbow Contusion  An elbow contusion is a deep bruise of the elbow. Contusions happen when an injury causes bleeding under the skin. Signs of bruising include pain, puffiness (swelling), and discolored skin. The contusion may turn blue, purple, or yellow. HOME CARE  Put ice on the injured area.  Put ice in a plastic bag.  Place a towel between your skin and the bag.  Leave the ice on for 15-20 minutes, 03-04 times a day.  Only take medicines as told by your doctor.  Rest your elbow until the pain and puffiness are better.  Raise (elevate) your elbow to lessen puffiness.  Put on  an elastic wrap as told by your doctor. You can take it off for sleeping, showers, and baths. If your fingers get cold, blue, or lose feeling (numb), take the wrap off. Put the wrap back on more loosely.  Use your elbow only as told by your doctor. If you are asked to do elbow exercises, do them as told.  Keep all doctor visits as told. GET HELP RIGHT AWAY IF:  You have more redness, puffiness, or pain in your elbow.  Your puffiness or pain is not helped by medicines.  You have puffiness of the hand and fingers.  You are not able to move your fingers or wrist.  You start to lose feeling in your hand or fingers.  Your fingers or hand become cold or blue. MAKE SURE YOU:   Understand these instructions.  Will watch your condition.  Will get help right away if you are not doing well or get worse.   This information is not intended to replace advice given to you by your health care provider. Make sure you discuss any questions you have with your health care provider.   Document Released: 07/18/2011 Document Revised: 01/28/2012 Document Reviewed: 03/13/2015 Elsevier Interactive Patient Education 2016 [place] to your elbow as needed for pain and swelling. Begin taking ibuprofen with food as needed for inflammation and pain. [facility] with [facility] if  any continued problems with your elbow.

## 2015-12-21 NOTE — ED Notes (Signed)
States she fell last pm and landed on left elbow  Increased pain with min swelling noted to elbow  Positive pulses noted

## 2016-01-13 ENCOUNTER — Emergency Department
Admission: EM | Admit: 2016-01-13 | Discharge: 2016-01-13 | Disposition: A | Discharge status: Home / Self Care | Payer: Self-pay | Attending: Emergency Medicine | Admitting: Emergency Medicine

## 2016-01-13 ENCOUNTER — Encounter: Payer: Self-pay | Admitting: Emergency Medicine

## 2016-01-13 DIAGNOSIS — Z5321 Procedure and treatment not carried out due to patient leaving prior to being seen by health care provider: Secondary | ICD-10-CM | POA: Insufficient documentation

## 2016-01-13 DIAGNOSIS — N939 Abnormal uterine and vaginal bleeding, unspecified: Secondary | ICD-10-CM | POA: Insufficient documentation

## 2016-01-13 LAB — CBC
HEMATOCRIT: 37.2 % (ref 35.0–47.0)
Hemoglobin: 12.4 g/dL (ref 12.0–16.0)
MCH: 27.8 pg (ref 26.0–34.0)
MCHC: 33.4 g/dL (ref 32.0–36.0)
MCV: 83.1 fL (ref 80.0–100.0)
Platelets: 288 10*3/uL (ref 150–440)
RBC: 4.48 MIL/uL (ref 3.80–5.20)
RDW: 15.1 % — ABNORMAL HIGH (ref 11.5–14.5)
WBC: 13.2 10*3/uL — AB (ref 3.6–11.0)

## 2016-01-13 LAB — URINALYSIS COMPLETE WITH MICROSCOPIC (ARMC ONLY)
BACTERIA UA: NONE SEEN
Bilirubin Urine: NEGATIVE
Glucose, UA: NEGATIVE mg/dL
KETONES UR: NEGATIVE mg/dL
LEUKOCYTES UA: NEGATIVE
NITRITE: NEGATIVE
Protein, ur: NEGATIVE mg/dL
SPECIFIC GRAVITY, URINE: 1.016 (ref 1.005–1.030)
pH: 6 (ref 5.0–8.0)

## 2016-01-13 NOTE — ED Notes (Signed)
POCT RESULTS WERE NEGATIVE

## 2016-01-13 NOTE — ED Notes (Signed)
Pt updated multiple times regarding wait. Pt verbalizes understanding.

## 2016-01-13 NOTE — ED Notes (Signed)
Pt states 3 weeks of vaginal bleeding. Pt also states has had unusual vaginal odor. Pt denies pain. Pt denies fever. Pt appears in no acute distress in triage. Pt states "i feel like my cervix is low, i can feel it when i put my finger up in me."

## 2016-01-13 NOTE — ED Notes (Signed)
Pt provided with blankets for comfort.

## 2016-05-26 ENCOUNTER — Emergency Department: Payer: Self-pay

## 2016-05-26 ENCOUNTER — Encounter: Payer: Self-pay | Admitting: Urgent Care

## 2016-05-26 ENCOUNTER — Emergency Department
Admission: EM | Admit: 2016-05-26 | Discharge: 2016-05-26 | Disposition: A | Discharge status: Home / Self Care | Payer: Self-pay | Attending: Emergency Medicine | Admitting: Emergency Medicine

## 2016-05-26 DIAGNOSIS — S8012XA Contusion of left lower leg, initial encounter: Secondary | ICD-10-CM | POA: Insufficient documentation

## 2016-05-26 DIAGNOSIS — F17210 Nicotine dependence, cigarettes, uncomplicated: Secondary | ICD-10-CM | POA: Insufficient documentation

## 2016-05-26 DIAGNOSIS — Y999 Unspecified external cause status: Secondary | ICD-10-CM | POA: Insufficient documentation

## 2016-05-26 DIAGNOSIS — S0083XA Contusion of other part of head, initial encounter: Secondary | ICD-10-CM

## 2016-05-26 DIAGNOSIS — S60222A Contusion of left hand, initial encounter: Secondary | ICD-10-CM | POA: Insufficient documentation

## 2016-05-26 DIAGNOSIS — R0789 Other chest pain: Secondary | ICD-10-CM | POA: Insufficient documentation

## 2016-05-26 DIAGNOSIS — Z791 Long term (current) use of non-steroidal anti-inflammatories (NSAID): Secondary | ICD-10-CM | POA: Insufficient documentation

## 2016-05-26 DIAGNOSIS — S8011XA Contusion of right lower leg, initial encounter: Secondary | ICD-10-CM | POA: Insufficient documentation

## 2016-05-26 DIAGNOSIS — S60221A Contusion of right hand, initial encounter: Secondary | ICD-10-CM | POA: Insufficient documentation

## 2016-05-26 DIAGNOSIS — Y9389 Activity, other specified: Secondary | ICD-10-CM | POA: Insufficient documentation

## 2016-05-26 DIAGNOSIS — S022XXA Fracture of nasal bones, initial encounter for closed fracture: Secondary | ICD-10-CM | POA: Insufficient documentation

## 2016-05-26 DIAGNOSIS — Y929 Unspecified place or not applicable: Secondary | ICD-10-CM | POA: Insufficient documentation

## 2016-05-26 MED ORDER — ETODOLAC 200 MG PO CAPS: 200.0000 mg | ORAL_CAPSULE | Freq: Three times a day (TID) | ORAL | 0 refills | Status: DC
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
## 2016-05-26 NOTE — ED Notes (Signed)
RN walking patient to the room. Patient now reporting pain in her LEFT rib area. Patient reports that she was "yawning hard" while in the waiting room and began to experience pain. States, "Now how will they do the xrays? Can I get a scan from my head all the way down to my pelvic area?" RN advised patient that imaging orders would be decided by the attending EDP. Dr. Dahlia Client made aware of the changes that have occurred since patient initially checked in, and of the request that she has made for "scans".
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
## 2016-10-17 ENCOUNTER — Encounter: Payer: Self-pay | Admitting: *Deleted

## 2016-10-17 ENCOUNTER — Emergency Department
Admission: EM | Admit: 2016-10-17 | Discharge: 2016-10-17 | Disposition: A | Discharge status: Home / Self Care | Payer: Self-pay | Attending: Emergency Medicine | Admitting: Emergency Medicine

## 2016-10-17 DIAGNOSIS — N760 Acute vaginitis: Secondary | ICD-10-CM | POA: Insufficient documentation

## 2016-10-17 DIAGNOSIS — F17210 Nicotine dependence, cigarettes, uncomplicated: Secondary | ICD-10-CM | POA: Insufficient documentation

## 2016-10-17 DIAGNOSIS — Z79899 Other long term (current) drug therapy: Secondary | ICD-10-CM | POA: Insufficient documentation

## 2016-10-17 DIAGNOSIS — B9689 Other specified bacterial agents as the cause of diseases classified elsewhere: Secondary | ICD-10-CM

## 2016-10-17 LAB — CHLAMYDIA/NGC RT PCR (ARMC ONLY)
Chlamydia Tr: NOT DETECTED
N gonorrhoeae: NOT DETECTED

## 2016-10-17 LAB — WET PREP, GENITAL
SPERM: NONE SEEN
TRICH WET PREP: NONE SEEN
Yeast Wet Prep HPF POC: NONE SEEN

## 2016-10-17 LAB — URINALYSIS, ROUTINE W REFLEX MICROSCOPIC
Bilirubin Urine: NEGATIVE
Glucose, UA: NEGATIVE mg/dL
HGB URINE DIPSTICK: NEGATIVE
Ketones, ur: NEGATIVE mg/dL
Leukocytes, UA: NEGATIVE
NITRITE: NEGATIVE
PH: 5 (ref 5.0–8.0)
Protein, ur: NEGATIVE mg/dL
Specific Gravity, Urine: 1.025 (ref 1.005–1.030)

## 2016-10-17 LAB — POCT PREGNANCY, URINE: Preg Test, Ur: NEGATIVE

## 2016-10-17 MED ORDER — METRONIDAZOLE 500 MG PO TABS: 500.0000 mg | ORAL_TABLET | Freq: Two times a day (BID) | ORAL | 0 refills | Status: DC

## 2016-10-17 NOTE — ED Triage Notes (Signed)
Pt complains of a yellow vaginal discharge with an odor, pt denies any other symptoms

## 2016-10-17 NOTE — Discharge Instructions (Signed)
Take all the antibiotic until completely finished. Do not mix this antibiotic with any alcohol products including cough syrup. Follow-up with your doctor at [facility] if any continued problems.

## 2016-10-17 NOTE — ED Provider Notes (Signed)
[facility] Emergency Department Provider Note   ____________________________________________   First MD Initiated Contact with Patient 10/17/16 1133     (approximate)  I have reviewed the triage vital signs and the nursing notes.   HISTORY  Chief Complaint Vaginal Discharge    HPI Jennifer Wiggins is a 46 y.o. female is here with complaint of yellow vaginal discharge and odor. Patient states that this began the first of the week. Patient states that last week she had a "normal period".  She states after that she began having a discharge but had odor to it. She denies any pain, fever, chills, nausea or vomiting. Patient states that she was in a relationship with someone for 6 years that broke up. She does not answer the question of whether it there was protected sex or not. She rates her discomfort as 7 out of 10.   Past Medical History:  Diagnosis Date  . Uterine fibroid     There are no active problems to display for this patient.   Past Surgical History:  Procedure Laterality Date  . CESAREAN SECTION    . TUBAL LIGATION      Prior to Admission medications   Medication Sig Start Date End Date Taking? Authorizing Provider  fluticasone (FLONASE) 50 MCG/ACT nasal spray Place 2 sprays into both nostrils daily.    Historical Provider, MD  metroNIDAZOLE (FLAGYL) 500 MG tablet Take 1 tablet (500 mg total) by mouth 2 (two) times daily. 10/17/16   Johnn Hai, PA-C  sertraline (ZOLOFT) 25 MG tablet Take 25 mg by mouth daily.    Historical Provider, MD    Allergies Patient has no known allergies.  No family history on file.  Social History Social History  Substance Use Topics  . Smoking status: Current Every Day Smoker    Packs/day: 0.50    Types: Cigarettes  . Smokeless tobacco: Never Used  . Alcohol use Yes    Review of Systems Constitutional: No fever/chills Cardiovascular: Denies chest pain. Respiratory: Denies shortness of  breath. Gastrointestinal: No abdominal pain.  No nausea, no vomiting.  Genitourinary: Positive vaginal discharge. Musculoskeletal: Negative for back pain. Skin: Negative for rash. Neurological: Negative for headaches, focal weakness or numbness.  10-point ROS otherwise negative.  ____________________________________________   PHYSICAL EXAM:  VITAL SIGNS: ED Triage Vitals  Enc Vitals Group     BP 10/17/16 1024 (!) 154/81     Pulse Rate 10/17/16 1024 64     Resp 10/17/16 1024 18     Temp 10/17/16 1024 97.8 F (36.6 C)     Temp Source 10/17/16 1024 Oral     SpO2 10/17/16 1024 100 %     Weight 10/17/16 1024 160 lb (72.6 kg)     Height 10/17/16 1024 5\' 4"  (1.626 m)     Head Circumference --      Peak Flow --      Pain Score 10/17/16 1110 7     Pain Loc --      Pain Edu? --      Excl. in [place]? --     Constitutional: Alert and oriented. Well appearing and in no acute distress. Eyes: Conjunctivae are normal. PERRL. EOMI. Head: Atraumatic. Nose: No congestion/rhinnorhea. Neck: No stridor.   Cardiovascular: Normal rate, regular rhythm. Grossly normal heart sounds.  Good peripheral circulation. Respiratory: Normal respiratory effort.  No retractions. Lungs CTAB. Gastrointestinal: Soft and nontender. No distention. Bowel sounds normoactive 4 quadrants. Genitourinary: Normal external female genitalia.  There is white, clear vaginal secretions noted. Bimanual exam was negative. No masses or tenderness was noted bilaterally. Musculoskeletal: No lower extremity tenderness nor edema.  No joint effusions. Neurologic:  Normal speech and language. No gross focal neurologic deficits are appreciated. No gait instability. Skin:  Skin is warm, dry and intact. No rash noted. Psychiatric: Mood and affect are normal. Speech and behavior are normal.  ____________________________________________   LABS (all labs ordered are listed, but only abnormal results are displayed)  Labs Reviewed  WET  PREP, GENITAL - Abnormal; Notable for the following:       Result Value   Clue Cells Wet Prep HPF POC PRESENT (*)    WBC, Wet Prep HPF POC RARE (*)    All other components within normal limits  URINALYSIS, ROUTINE W REFLEX MICROSCOPIC - Abnormal; Notable for the following:    Color, Urine YELLOW (*)    APPearance CLEAR (*)    All other components within normal limits  CHLAMYDIA/NGC RT PCR ([facility] ONLY)  POC URINE PREG, ED  POCT PREGNANCY, URINE     PROCEDURES  Procedure(s) performed: None  Procedures  Critical Care performed: No  ____________________________________________   INITIAL IMPRESSION / ASSESSMENT AND PLAN / ED COURSE  Pertinent labs & imaging results that were available during my care of the patient were reviewed by me and considered in my medical decision making (see chart for details).  Wet prep was positive for clue cells. Patient was placed on Flagyl 500 mg twice a day for 7 days. Patient was given discharge instructions on this. She'll follow-up with [facility] here with any continued problems.   ____________________________________________   FINAL CLINICAL IMPRESSION(S) / ED DIAGNOSES  Final diagnoses:  Bacterial vaginosis      NEW MEDICATIONS STARTED DURING THIS VISIT:  Discharge Medication List as of 10/17/2016 12:58 PM    START taking these medications   Details  metroNIDAZOLE (FLAGYL) 500 MG tablet Take 1 tablet (500 mg total) by mouth 2 (two) times daily., Starting Thu 10/17/2016, Print         Note:  This document was prepared using Dragon voice recognition software and may include unintentional dictation errors.    Johnn Hai, PA-C 10/17/16 [place], MD 10/17/16 [phone]

## 2017-01-17 ENCOUNTER — Emergency Department: Payer: Self-pay

## 2017-01-17 ENCOUNTER — Emergency Department
Admission: EM | Admit: 2017-01-17 | Discharge: 2017-01-17 | Disposition: A | Discharge status: Home / Self Care | Payer: Self-pay | Attending: Emergency Medicine | Admitting: Emergency Medicine

## 2017-01-17 ENCOUNTER — Encounter: Payer: Self-pay | Admitting: Emergency Medicine

## 2017-01-17 DIAGNOSIS — M79672 Pain in left foot: Secondary | ICD-10-CM | POA: Insufficient documentation

## 2017-01-17 DIAGNOSIS — Z79899 Other long term (current) drug therapy: Secondary | ICD-10-CM | POA: Insufficient documentation

## 2017-01-17 DIAGNOSIS — F17210 Nicotine dependence, cigarettes, uncomplicated: Secondary | ICD-10-CM | POA: Insufficient documentation

## 2017-01-17 NOTE — ED Notes (Signed)
See triage note  states she developed swelling to left foot couple of days ago  Denies any injury  Ambulates well

## 2017-01-17 NOTE — Discharge Instructions (Signed)
Follow up with your primary care provider if any continued problems.  Ice and elevate when needed.  Wear good supportive shoes. Continue taking your meloxicam.

## 2017-01-17 NOTE — ED Provider Notes (Signed)
[facility] Emergency Department Provider Note ____________________________________________  Time seen: 7:09 AM  I have reviewed the triage vital signs and the nursing notes.  HISTORY  Chief Complaint  Foot Pain   HPI Jennifer Wiggins is a 46 y.o. female is here with complaint of left foot pain since yesterday. Patient states that it has been swollen. She denies any recent injury or injuries of the past with her foot. She has taken some over-the-counter medication without any relief of her pain. Patient finds it increased pain with weightbearing or ambulation. Currently she rates her pain is 3 out of 10.     Past Medical History:  Diagnosis Date  . Uterine fibroid     There are no active problems to display for this patient.   Past Surgical History:  Procedure Laterality Date  . CESAREAN SECTION    . TUBAL LIGATION      Prior to Admission medications   Medication Sig Start Date End Date Taking? Authorizing Provider  meloxicam (MOBIC) 15 MG tablet Take 15 mg by mouth daily.   Yes [provider]  sertraline (ZOLOFT) 25 MG tablet Take 25 mg by mouth daily.    [provider]    Allergies Patient has no known allergies.  No family history on file.  Social History Social History  Substance Use Topics  . Smoking status: Current Every Day Smoker    Packs/day: 0.50    Types: Cigarettes  . Smokeless tobacco: Never Used  . Alcohol use Yes    Review of Systems  Constitutional: Negative for fever. Cardiovascular: Negative for chest pain. Respiratory: Negative for shortness of breath. Musculoskeletal: Positive for left foot pain. Skin: Negative for rash. Neurological: Negative for focal weakness or numbness. ____________________________________________  PHYSICAL EXAM:  VITAL SIGNS: ED Triage Vitals  Enc Vitals Group     BP 01/17/17 0644 (!) 150/75     Pulse Rate 01/17/17 0644 68     Resp 01/17/17 0644 20     Temp  01/17/17 0644 98.1 F (36.7 C)     Temp Source 01/17/17 0644 Oral     SpO2 --      Weight 01/17/17 0635 180 lb (81.6 kg)     Height 01/17/17 0635 5\' 4"  (1.626 m)     Head Circumference --      Peak Flow --      Pain Score --      Pain Loc --      Pain Edu? --      Excl. in [place]? --     Constitutional: Alert and oriented. Well appearing and in no distress. Head: Normocephalic and atraumatic. Hematological/Lymphatic/Immunological: No cervical lymphadenopathy. Cardiovascular: Normal rate, regular rhythm. Normal distal pulses. Respiratory: Normal respiratory effort. No wheezes/rales/rhonchi. Musculoskeletal: Examination of the left foot there is no gross deformity noted. There is some minimal diffuse tenderness on palpation of the dorsal aspect. There is no erythema, ecchymosis or abrasions noted. Patient is able to move digits distally without any difficulty. Motor sensory function intact. Neurologic:   Normal speech and language. No gross focal neurologic deficits are appreciated. Skin:  Skin is warm, dry and intact. No rash noted. Psychiatric: Mood and affect are normal. Patient exhibits appropriate insight and judgment.   RADIOLOGY Left foot x-ray per radiologist: IMPRESSION:  Degenerative changes first MTP joint. No acute abnormality  identified .  I, Johnn Hai, personally viewed and evaluated these images (plain radiographs) as part of my medical decision making, as  well as reviewing the written report by the radiologist.    [place] / [place] / ED COURSE  Patient already has a prescription for meloxicam that she picked up the first week and is only taking 1. Patient is encouraged to continue taking her meloxicam and follow-up with her PCP at [facility] if any continued problems with her foot.    ____________________________________________  FINAL CLINICAL IMPRESSION(S) / ED DIAGNOSES  Final diagnoses:  Foot pain, left     Philomena Course 01/17/17 1717    Schaevitz, Randall An, MD 01/18/17 [phone]

## 2017-01-17 NOTE — ED Triage Notes (Signed)
Patient ambulatory to triage with steady gait, without difficulty or distress noted; pt reports having left foot swelling since yesterday with no known injury; denies any pain

## 2017-02-24 ENCOUNTER — Emergency Department
Admission: EM | Admit: 2017-02-24 | Discharge: 2017-02-24 | Disposition: A | Discharge status: Home / Self Care | Payer: Self-pay | Attending: Emergency Medicine | Admitting: Emergency Medicine

## 2017-02-24 ENCOUNTER — Encounter: Payer: Self-pay | Admitting: Emergency Medicine

## 2017-02-24 DIAGNOSIS — Z79899 Other long term (current) drug therapy: Secondary | ICD-10-CM | POA: Insufficient documentation

## 2017-02-24 DIAGNOSIS — F17210 Nicotine dependence, cigarettes, uncomplicated: Secondary | ICD-10-CM | POA: Insufficient documentation

## 2017-02-24 DIAGNOSIS — N939 Abnormal uterine and vaginal bleeding, unspecified: Secondary | ICD-10-CM | POA: Insufficient documentation

## 2017-02-24 DIAGNOSIS — R1030 Lower abdominal pain, unspecified: Secondary | ICD-10-CM | POA: Insufficient documentation

## 2017-02-24 DIAGNOSIS — B9689 Other specified bacterial agents as the cause of diseases classified elsewhere: Secondary | ICD-10-CM

## 2017-02-24 DIAGNOSIS — N760 Acute vaginitis: Secondary | ICD-10-CM | POA: Insufficient documentation

## 2017-02-24 LAB — URINALYSIS, ROUTINE W REFLEX MICROSCOPIC
Bilirubin Urine: NEGATIVE
GLUCOSE, UA: NEGATIVE mg/dL
HGB URINE DIPSTICK: NEGATIVE
Ketones, ur: NEGATIVE mg/dL
Leukocytes, UA: NEGATIVE
Nitrite: NEGATIVE
PROTEIN: NEGATIVE mg/dL
SPECIFIC GRAVITY, URINE: 1.018 (ref 1.005–1.030)
pH: 5 (ref 5.0–8.0)

## 2017-02-24 LAB — COMPREHENSIVE METABOLIC PANEL
ALT: 26 U/L (ref 14–54)
AST: 19 U/L (ref 15–41)
Albumin: 3.8 g/dL (ref 3.5–5.0)
Alkaline Phosphatase: 121 U/L (ref 38–126)
Anion gap: 8 (ref 5–15)
BUN: 14 mg/dL (ref 6–20)
CO2: 24 mmol/L (ref 22–32)
Calcium: 8.8 mg/dL — ABNORMAL LOW (ref 8.9–10.3)
Chloride: 106 mmol/L (ref 101–111)
Creatinine, Ser: 1.01 mg/dL — ABNORMAL HIGH (ref 0.44–1.00)
GFR calc Af Amer: >60 mL/min (ref >60)
GFR calc non Af Amer: >60 mL/min (ref >60)
Glucose, Bld: 88 mg/dL (ref 65–99)
Potassium: 4.1 mmol/L (ref 3.5–5.1)
Sodium: 138 mmol/L (ref 135–145)
Total Bilirubin: 0.6 mg/dL (ref 0.3–1.2)
Total Protein: 7.2 g/dL (ref 6.5–8.1)

## 2017-02-24 LAB — CHLAMYDIA/NGC RT PCR (ARMC ONLY)
Chlamydia Tr: NOT DETECTED
N GONORRHOEAE: NOT DETECTED

## 2017-02-24 LAB — CBC
HCT: 42.3 % (ref 35.0–47.0)
Hemoglobin: 14.2 g/dL (ref 12.0–16.0)
MCH: 28.1 pg (ref 26.0–34.0)
MCHC: 33.5 g/dL (ref 32.0–36.0)
MCV: 83.8 fL (ref 80.0–100.0)
PLATELETS: 276 10*3/uL (ref 150–440)
RBC: 5.05 MIL/uL (ref 3.80–5.20)
RDW: 15.8 % — ABNORMAL HIGH (ref 11.5–14.5)
WBC: 9.8 10*3/uL (ref 3.6–11.0)

## 2017-02-24 LAB — WET PREP, GENITAL
SPERM: NONE SEEN
Trich, Wet Prep: NONE SEEN
YEAST WET PREP: NONE SEEN

## 2017-02-24 LAB — LIPASE, BLOOD: Lipase: 24 U/L (ref 11–51)

## 2017-02-24 MED ORDER — METRONIDAZOLE 500 MG PO TABS: 500.0000 mg | ORAL_TABLET | Freq: Two times a day (BID) | ORAL | 0 refills | Status: AC

## 2017-02-24 NOTE — ED Provider Notes (Signed)
[facility] Emergency Department Provider Note  Time seen: 9:52 AM  I have reviewed the triage vital signs and the nursing notes.   HISTORY  Chief Complaint Abdominal Pain and Vaginal Bleeding    HPI Jennifer Wiggins is a 46 y.o. female who presents to the emergency department for 3 days of right lower quadrant discomfort and 2 months of vaginal spotting. According to the patient for the past 3 days she has been experiencing a mild dull pain in the right lower quadrant. Denies any nausea, vomiting, diarrhea. Denies dysuria. She has been having some spotting for the past 2-3 months with a foul smell. States she has not been sexually active for over one month. Describes her abdominal pain is mild aching type pain.  Past Medical History:  Diagnosis Date  . Uterine fibroid     There are no active problems to display for this patient.   Past Surgical History:  Procedure Laterality Date  . CESAREAN SECTION    . TUBAL LIGATION      Prior to Admission medications   Medication Sig Start Date End Date Taking? Authorizing Provider  meloxicam (MOBIC) 15 MG tablet Take 15 mg by mouth daily.    [provider]  sertraline (ZOLOFT) 25 MG tablet Take 25 mg by mouth daily.    [provider]    No Known Allergies  No family history on file.  Social History Social History  Substance Use Topics  . Smoking status: Current Every Day Smoker    Packs/day: 0.50    Types: Cigarettes  . Smokeless tobacco: Never Used  . Alcohol use Yes    Review of Systems Constitutional: Negative for fever. Cardiovascular: Negative for chest pain. Respiratory: Negative for shortness of breath. Gastrointestinal: Mild right lower quadrant pain. Negative nausea and vomiting or diarrhea. Genitourinary: Negative for dysuria. Mild spotting 2-3 months, foul-smelling. Musculoskeletal: Mild lower back pain, chronic. Neurological: Negative for headache All other ROS  negative  ____________________________________________   PHYSICAL EXAM:  VITAL SIGNS: ED Triage Vitals  Enc Vitals Group     BP 02/24/17 0911 132/78     Pulse Rate 02/24/17 0911 (!) 57     Resp 02/24/17 0911 18     Temp 02/24/17 0911 97.9 F (36.6 C)     Temp Source 02/24/17 0911 Oral     SpO2 02/24/17 0911 98 %     Weight 02/24/17 0912 175 lb (79.4 kg)     Height 02/24/17 0912 5\' 4"  (1.626 m)     Head Circumference --      Peak Flow --      Pain Score 02/24/17 0913 7     Pain Loc --      Pain Edu? --      Excl. in [place]? --     Constitutional: Alert and oriented. Well appearing and in no distress. Eyes: Normal exam ENT   Head: Normocephalic and atraumatic   Mouth/Throat: Mucous membranes are moist. Cardiovascular: Normal rate, regular rhythm. No murmur Respiratory: Normal respiratory effort without tachypnea nor retractions. Breath sounds are clear  Gastrointestinal: Soft, very slight right lower quadrant tenderness, no rebound or guarding. No distention. No reaction to palpation. Musculoskeletal: Nontender with normal range of motion in all extremities. Neurologic:  Normal speech and language. No gross focal neurologic deficits  Skin:  Skin is warm, dry and intact.  Psychiatric: Mood and affect are normal.   ____________________________________________    INITIAL IMPRESSION / ASSESSMENT AND PLAN /  ED COURSE  Pertinent labs & imaging results that were available during my care of the patient were reviewed by me and considered in my medical decision making (see chart for details).  The patient presents to the emergency department for right lower quadrant discomfort 3 days and 2 months of vaginal spotting with foul smell. I discussed the possibility of perimenopausal symptoms as the patient has been spotting she states for 2-3 months, however given the foul smell reported by the patient we'll obtain swabs and perform a pelvic examination. Patient has minimal  tenderness in the right lower quadrant on exam, no reaction to deep palpation. We will check labs including a urinalysis.  Clue cells present we'll treat for BV. Labs otherwise within normal limits including white blood cell count. We will discharge with PCP and OB/GYN follow-up. Patient agreeable plan.  ____________________________________________   FINAL CLINICAL IMPRESSION(S) / ED DIAGNOSES  Right lower quadrant pain Bacterial vaginitis   Harvest Dark, MD 02/24/17 1136

## 2017-02-24 NOTE — ED Triage Notes (Signed)
Pt reports RLQ abdominal pain for two days. Denies NVD. Pt reports vaginal bleeding intermittently since June. Pt reports bleeding as spotting.

## 2017-03-27 ENCOUNTER — Telehealth: Payer: Self-pay | Admitting: Pharmacy Technician

## 2017-03-27 NOTE — Telephone Encounter (Signed)
Patient eligible to receive medication assistance at [facility] through 2018, as long as eligibility requirements continue to be met.  Jennifer Wiggins Care Manager [facility] 

## 2017-04-07 ENCOUNTER — Encounter: Payer: Self-pay | Admitting: Medical Oncology

## 2017-04-07 ENCOUNTER — Emergency Department
Admission: EM | Admit: 2017-04-07 | Discharge: 2017-04-07 | Disposition: A | Discharge status: Home / Self Care | Payer: Self-pay | Attending: Emergency Medicine | Admitting: Emergency Medicine

## 2017-04-07 DIAGNOSIS — Y998 Other external cause status: Secondary | ICD-10-CM | POA: Insufficient documentation

## 2017-04-07 DIAGNOSIS — Y9389 Activity, other specified: Secondary | ICD-10-CM | POA: Insufficient documentation

## 2017-04-07 DIAGNOSIS — X58XXXA Exposure to other specified factors, initial encounter: Secondary | ICD-10-CM | POA: Insufficient documentation

## 2017-04-07 DIAGNOSIS — Z91040 Latex allergy status: Secondary | ICD-10-CM | POA: Insufficient documentation

## 2017-04-07 DIAGNOSIS — Y929 Unspecified place or not applicable: Secondary | ICD-10-CM | POA: Insufficient documentation

## 2017-04-07 DIAGNOSIS — S0501XA Injury of conjunctiva and corneal abrasion without foreign body, right eye, initial encounter: Secondary | ICD-10-CM

## 2017-04-07 DIAGNOSIS — F17210 Nicotine dependence, cigarettes, uncomplicated: Secondary | ICD-10-CM | POA: Insufficient documentation

## 2017-04-07 DIAGNOSIS — Z79899 Other long term (current) drug therapy: Secondary | ICD-10-CM | POA: Insufficient documentation

## 2017-04-07 MED ORDER — EYE WASH OPHTH SOLN
OPHTHALMIC | Status: AC
  Administered 2017-04-07: 10:00:00
  Filled 2017-04-07: qty 118

## 2017-04-07 MED ORDER — KETOROLAC TROMETHAMINE 0.5 % OP SOLN: 1.0000 [drp] | Freq: Three times a day (TID) | OPHTHALMIC | 0 refills | Status: DC | PRN

## 2017-04-07 MED ORDER — TETRACAINE HCL 0.5 % OP SOLN
OPHTHALMIC | Status: AC
  Administered 2017-04-07: 10:00:00
  Filled 2017-04-07: qty 4

## 2017-04-07 MED ORDER — FLUORESCEIN SODIUM 0.6 MG OP STRP
ORAL_STRIP | OPHTHALMIC | Status: AC
  Filled 2017-04-07: qty 1

## 2017-04-07 MED ORDER — ERYTHROMYCIN 5 MG/GM OP OINT: 1.0000 "application " | TOPICAL_OINTMENT | Freq: Four times a day (QID) | OPHTHALMIC | 0 refills | Status: DC

## 2017-04-07 NOTE — ED Notes (Signed)
See triage note  States she put a solution in her right eye yesterday  Developed redness and swelling  States she rinsed her eye las pm but conts to have redness and swelling

## 2017-04-07 NOTE — Discharge Instructions (Signed)
Take medication as prescribed. Return to emergency department if symptoms worsen and follow-up with PCP as needed.   °

## 2017-04-07 NOTE — ED Triage Notes (Signed)
Pt reports rt eye redness and pain since using contact solution yesterday.

## 2017-04-07 NOTE — ED Provider Notes (Signed)
[facility] Emergency Department Provider Note   ____________________________________________   I have reviewed the triage vital signs and the nursing notes.   HISTORY  Chief Complaint Eye Problem    HPI Jennifer Wiggins is a 46 y.o. female presents to the emergency department with right eye redness, swelling and decreased visual acuity after mistakenly putting contact solution in her eye yesterday. Patient reports attempting to flush her eye with saline solution without improvement of symptoms. Patient reports light sensitivity, inability to keep her eyelid open due to pain and blurriness. Patient denies fever, chills, headache, vision changes, chest pain, chest tightness, shortness of breath, abdominal pain, nausea and vomiting.  Past Medical History:  Diagnosis Date  . Uterine fibroid     There are no active problems to display for this patient.   Past Surgical History:  Procedure Laterality Date  . CESAREAN SECTION    . TUBAL LIGATION      Prior to Admission medications   Medication Sig Start Date End Date Taking? Authorizing Provider  erythromycin ophthalmic ointment Place 1 application into the left eye 4 (four) times daily. 04/07/17   Teondre Jarosz M, PA-C  ketorolac (ACULAR) 0.5 % ophthalmic solution Place 1 drop into the right eye 3 (three) times daily as needed. 04/07/17   Bernis Schreur M, PA-C  meloxicam (MOBIC) 15 MG tablet Take 15 mg by mouth daily.    [provider]  sertraline (ZOLOFT) 25 MG tablet Take 25 mg by mouth daily.    [provider]    Allergies Latex  No family history on file.  Social History Social History  Substance Use Topics  . Smoking status: Current Every Day Smoker    Packs/day: 0.50    Types: Cigarettes  . Smokeless tobacco: Never Used  . Alcohol use Yes    Review of Systems Constitutional: Negative for fever/chills Eyes: Right eye swelling, redness, blurriness and pain. ENT:   Negative for sore throat and for difficulty swallowing Cardiovascular: Denies chest pain. Respiratory: Denies cough. Denies shortness of breath. Gastrointestinal: No abdominal pain.  No nausea, vomiting, diarrhea. Genitourinary: Negative for dysuria. Musculoskeletal: Negative for back pain. Skin: Negative for rash. Neurological: Negative for headaches.   ____________________________________________   PHYSICAL EXAM:  VITAL SIGNS: ED Triage Vitals [04/07/17 0801]  Enc Vitals Group     BP 130/63     Pulse Rate 81     Resp 18     Temp 97.8 F (36.6 C)     Temp Source Oral     SpO2 98 %     Weight 180 lb (81.6 kg)     Height 5\' 4"  (1.626 m)     Head Circumference      Peak Flow      Pain Score 8     Pain Loc      Pain Edu?      Excl. in [place]?     Constitutional: Alert and oriented. Well appearing and in no acute distress.  Eyes: Subconjunctival hemorrhage of the right eye. Left eye conjunctiva normal. PERRL. EOMI. right upper eyelid swelling. Right eye watering without drainage. Head: Normocephalic and atraumatic. ENT:      Ears: Canals clear. TMs intact bilaterally.      Nose: No congestion/rhinnorhea.      Mouth/Throat: Mucous membranes are moist.  Neck:Supple. No thyromegaly. No stridor.  Cardiovascular: Normal rate, regular rhythm. Normal S1 and S2.  Good peripheral circulation. Respiratory: Normal respiratory effort without tachypnea or  retractions. Lungs CTAB. No wheezes/rales/rhonchi. Good air entry to the bases with no decreased or absent breath sounds. Hematological/Lymphatic/Immunological: No cervical lymphadenopathy. Cardiovascular: Normal rate, regular rhythm. Normal distal pulses. Gastrointestinal: Bowel sounds 4 quadrants. Soft and nontender to palpation. No guarding or rigidity. No palpable masses. No distention. No CVA tenderness. Musculoskeletal: Nontender with normal range of motion in all extremities. Neurologic: Normal speech and language. No gross focal  neurologic deficits are appreciated. No gait instability. Cranial nerves: II-X intact. No sensory loss or abnormal reflexes.  Skin:  Skin is warm, dry and intact. No rash noted. Psychiatric: Mood and affect are normal. Speech and behavior are normal. Patient exhibits appropriate insight and judgement.  ____________________________________________   LABS (all labs ordered are listed, but only abnormal results are displayed)  Labs Reviewed - No data to display ____________________________________________  EKG none ____________________________________________  RADIOLOGY none ____________________________________________   PROCEDURES  Procedure(s) performed: Fluorescein stain eye exam performed following physical exam:  Performed by: Jerolyn Shin Authorized by: Jerolyn Shin Consent: Verbal consent obtained. Risks and benefits: risks, benefits and alternatives were discussed Consent given by: patient Irrigated right eye with Eye Stream eye wash.  (1) drop Tetracaine instilled followed by instillation of fluorescein dye with fluorescein strip.  Examination with Sherral Hammers slit lamp performed: Small corneal abrasion noted at 3:00 position of right eye. No other defects noted.  Following the exam:  right eye irrigated with Eye Stream and (1) drop of Tetracaine instilled.   Patient tolerance: Patient tolerated the procedure well with no immediate complications   Critical Care performed: no ____________________________________________   INITIAL IMPRESSION / ASSESSMENT AND PLAN / ED COURSE  Pertinent labs & imaging results that were available during my care of the patient were reviewed by me and considered in my medical decision making (see chart for details).  Patient presents to emergency department with right eye pain History  physical exam findings are reassuring symptoms are consistent with small corneal abrasion and subconjunctival hemorrhage. Patient noted relief with  tetracaine drops given during the course of care in the emergency department. Patient will be prescribed erythromycin eye ointment and Acular eye drops. Patient advised to follow up with PCP as needed or return to the emergency department if symptoms return or worsen. Patient informed of clinical course, understand medical decision-making process, and agree with plan.  ____________________________________________   FINAL CLINICAL IMPRESSION(S) / ED DIAGNOSES  Final diagnoses:  Abrasion of right cornea, initial encounter       NEW MEDICATIONS STARTED DURING THIS VISIT:  Discharge Medication List as of 04/07/2017  9:41 AM    START taking these medications   Details  erythromycin ophthalmic ointment Place 1 application into the left eye 4 (four) times daily., Starting Mon 04/07/2017, Print    ketorolac (ACULAR) 0.5 % ophthalmic solution Place 1 drop into the right eye 3 (three) times daily as needed., Starting Mon 04/07/2017, Print         Note:  This document was prepared using Dragon voice recognition software and may include unintentional dictation errors.    Jerolyn Shin, PA-C 04/07/17 1508    Lavonia Drafts, MD 04/07/17 [phone]

## 2017-10-22 ENCOUNTER — Telehealth: Payer: Self-pay | Admitting: Pharmacy Technician

## 2017-10-22 NOTE — Telephone Encounter (Signed)
Received updated proof of income.  Patient eligible to receive medication assistance at [facility] through 2019, as long as eligibility requirements continue to be met.  [facility]

## 2017-11-17 ENCOUNTER — Encounter: Payer: Self-pay | Admitting: Emergency Medicine

## 2017-11-17 ENCOUNTER — Emergency Department: Payer: Self-pay

## 2017-11-17 ENCOUNTER — Emergency Department
Admission: EM | Admit: 2017-11-17 | Discharge: 2017-11-17 | Disposition: A | Discharge status: Home / Self Care | Payer: Self-pay | Attending: Emergency Medicine | Admitting: Emergency Medicine

## 2017-11-17 ENCOUNTER — Other Ambulatory Visit: Payer: Self-pay

## 2017-11-17 DIAGNOSIS — Z79899 Other long term (current) drug therapy: Secondary | ICD-10-CM | POA: Insufficient documentation

## 2017-11-17 DIAGNOSIS — M25521 Pain in right elbow: Secondary | ICD-10-CM | POA: Insufficient documentation

## 2017-11-17 DIAGNOSIS — F17210 Nicotine dependence, cigarettes, uncomplicated: Secondary | ICD-10-CM | POA: Insufficient documentation

## 2017-11-17 DIAGNOSIS — G8929 Other chronic pain: Secondary | ICD-10-CM

## 2017-11-17 LAB — URINALYSIS, COMPLETE (UACMP) WITH MICROSCOPIC
BACTERIA UA: NONE SEEN
Bilirubin Urine: NEGATIVE
Glucose, UA: NEGATIVE mg/dL
Hgb urine dipstick: NEGATIVE
Ketones, ur: NEGATIVE mg/dL
NITRITE: NEGATIVE
PH: 5 (ref 5.0–8.0)
Protein, ur: NEGATIVE mg/dL
SPECIFIC GRAVITY, URINE: 1.019 (ref 1.005–1.030)

## 2017-11-17 LAB — POCT PREGNANCY, URINE: Preg Test, Ur: NEGATIVE

## 2017-11-17 MED ORDER — NAPROXEN 500 MG PO TABS: 500.0000 mg | ORAL_TABLET | Freq: Two times a day (BID) | ORAL | Status: DC

## 2017-11-17 MED ORDER — TRAMADOL HCL 50 MG PO TABS: 50.0000 mg | ORAL_TABLET | Freq: Two times a day (BID) | ORAL | 0 refills | Status: DC | PRN

## 2017-11-17 NOTE — Discharge Instructions (Signed)
Wear arm sling as needed.

## 2017-11-17 NOTE — ED Triage Notes (Signed)
Says right elbow pain for 1 year.  Came here then, but med is not working

## 2017-11-17 NOTE — ED Provider Notes (Signed)
[facility] Emergency Department Provider Note   ____________________________________________   First MD Initiated Contact with Patient 11/17/17 1114     (approximate)  I have reviewed the triage vital signs and the nursing notes.   HISTORY  Chief Complaint Elbow Pain    HPI Jennifer Wiggins is a 47 y.o. female patient complaining of right elbow pain for 1 year.  Patient states takes meloxicam for right shoulder pain but has not helped her elbow.  Patient she was diagnosed with bursitis of the right shoulder approximately 2 years ago.  Patient denies provocative incident for complaint.  Patient rates the pain as a 9/10.  Patient described the pain is "achy".  No palliative measures for complaint.  Patient is right-hand dominant.  Past Medical History:  Diagnosis Date  . Uterine fibroid     There are no active problems to display for this patient.   Past Surgical History:  Procedure Laterality Date  . CESAREAN SECTION    . TUBAL LIGATION      Prior to Admission medications   Medication Sig Start Date End Date Taking? Authorizing Provider  erythromycin ophthalmic ointment Place 1 application into the left eye 4 (four) times daily. 04/07/17   Little, Traci M, PA-C  ketorolac (ACULAR) 0.5 % ophthalmic solution Place 1 drop into the right eye 3 (three) times daily as needed. 04/07/17   Little, Traci M, PA-C  meloxicam (MOBIC) 15 MG tablet Take 15 mg by mouth daily.    [provider]  sertraline (ZOLOFT) 25 MG tablet Take 25 mg by mouth daily.    [provider]  ferrous sulfate 325 (65 FE) MG tablet Take 1 tablet (325 mg total) by mouth daily. 01/17/15 12/21/15  Lavonia Drafts, MD    Allergies Latex  No family history on file.  Social History Social History   Tobacco Use  . Smoking status: Current Every Day Smoker    Packs/day: 0.50    Types: Cigarettes  . Smokeless tobacco: Never Used  Substance Use Topics  . Alcohol use: Not  Currently  . Drug use: No    Review of Systems Constitutional: No fever/chills Eyes: No visual changes. ENT: No sore throat. Cardiovascular: Denies chest pain. Respiratory: Denies shortness of breath. Gastrointestinal: No abdominal pain.  No nausea, no vomiting.  No diarrhea.  No constipation. Genitourinary: Negative for dysuria. Musculoskeletal: Negative for back pain. Skin: Negative for rash. Neurological: Negative for headaches, focal weakness or numbness. Allergic/Immunilogical: Latex ____________________________________________   PHYSICAL EXAM:  VITAL SIGNS: ED Triage Vitals  Enc Vitals Group     BP 11/17/17 1059 131/62     Pulse Rate 11/17/17 1059 80     Resp 11/17/17 1059 14     Temp 11/17/17 1059 98.5 F (36.9 C)     Temp Source 11/17/17 1059 Oral     SpO2 11/17/17 1059 100 %     Weight 11/17/17 1100 190 lb (86.2 kg)     Height 11/17/17 1100 5\' 5"  (1.651 m)     Head Circumference --      Peak Flow --      Pain Score 11/17/17 1100 9     Pain Loc --      Pain Edu? --      Excl. in [place]? --    Constitutional: Alert and oriented. Well appearing and in no acute distress. Hematological/Lymphatic/Immunilogical: No cervical lymphadenopathy. Cardiovascular: Normal rate, regular rhythm. Grossly normal heart sounds.  Good peripheral circulation. Respiratory: Normal  respiratory effort.  No retractions. Lungs CTAB. Musculoskeletal: N no obvious deformity to the right elbow.  Patient has full neck range of motion of the right elbow.  Patient has moderate guarding palpation medial epicondyle area. Neurologic:  Normal speech and language. No gross focal neurologic deficits are appreciated. No gait instability. Skin:  Skin is warm, dry and intact. No rash noted. Psychiatric: Mood and affect are normal. Speech and behavior are normal.  ____________________________________________   LABS (all labs ordered are listed, but only abnormal results are displayed)  Labs Reviewed    URINALYSIS, COMPLETE (UACMP) WITH MICROSCOPIC  POC URINE PREG, ED  POCT PREGNANCY, URINE   ____________________________________________  EKG   ____________________________________________  RADIOLOGY  No acute findings on x-ray of the right elbow.  Official radiology report(s): No results found.  ____________________________________________   PROCEDURES  Procedure(s) performed: None  Procedures  Critical Care performed: No  ____________________________________________   INITIAL IMPRESSION / ASSESSMENT AND PLAN / ED COURSE  As part of my medical decision making, I reviewed the following data within the electronic MEDICAL RECORD NUMBER    Chronic left elbow pain.  Patient provides no provocative incident for complaint.  Examination was unremarkable.  Discussed x-ray findings with patient.  Patient was referred to orthopedic for definitive evaluation and treatment.  Patient placed in arm sling and advised take medication as directed.      ____________________________________________   FINAL CLINICAL IMPRESSION(S) / ED DIAGNOSES  Final diagnoses:  None     ED Discharge Orders    None       Note:  This document was prepared using Dragon voice recognition software and may include unintentional dictation errors.    Sable Feil, PA-C 11/17/17 1311    Harvest Dark, MD 11/17/17 [phone]

## 2017-11-17 NOTE — ED Triage Notes (Signed)
als says her back has been hurting so she want her urine checked.  Says they wouldn't check it at health dept.  Was checked for std 3 week ago.

## 2018-01-16 ENCOUNTER — Other Ambulatory Visit: Payer: Self-pay

## 2018-01-16 ENCOUNTER — Emergency Department
Admission: EM | Admit: 2018-01-16 | Discharge: 2018-01-16 | Disposition: A | Discharge status: Home / Self Care | Payer: Self-pay | Attending: Emergency Medicine | Admitting: Emergency Medicine

## 2018-01-16 DIAGNOSIS — Z3202 Encounter for pregnancy test, result negative: Secondary | ICD-10-CM | POA: Insufficient documentation

## 2018-01-16 DIAGNOSIS — Z79899 Other long term (current) drug therapy: Secondary | ICD-10-CM | POA: Insufficient documentation

## 2018-01-16 DIAGNOSIS — F17210 Nicotine dependence, cigarettes, uncomplicated: Secondary | ICD-10-CM | POA: Insufficient documentation

## 2018-01-16 DIAGNOSIS — N390 Urinary tract infection, site not specified: Secondary | ICD-10-CM | POA: Insufficient documentation

## 2018-01-16 LAB — COMPREHENSIVE METABOLIC PANEL
ALBUMIN: 3.8 g/dL (ref 3.5–5.0)
ALK PHOS: 100 U/L (ref 38–126)
ALT: 15 U/L (ref 14–54)
ANION GAP: 5 (ref 5–15)
AST: 23 U/L (ref 15–41)
BUN: 19 mg/dL (ref 6–20)
CALCIUM: 8.7 mg/dL — AB (ref 8.9–10.3)
CO2: 23 mmol/L (ref 22–32)
Chloride: 110 mmol/L (ref 101–111)
Creatinine, Ser: 0.97 mg/dL (ref 0.44–1.00)
GFR calc Af Amer: >60 mL/min (ref >60)
GFR calc non Af Amer: >60 mL/min (ref >60)
GLUCOSE: 106 mg/dL — AB (ref 65–99)
POTASSIUM: 4.3 mmol/L (ref 3.5–5.1)
SODIUM: 138 mmol/L (ref 135–145)
Total Bilirubin: 0.4 mg/dL (ref 0.3–1.2)
Total Protein: 7.2 g/dL (ref 6.5–8.1)

## 2018-01-16 LAB — URINALYSIS, COMPLETE (UACMP) WITH MICROSCOPIC
Bilirubin Urine: NEGATIVE
Glucose, UA: NEGATIVE mg/dL
Ketones, ur: 5 mg/dL — AB
Nitrite: NEGATIVE
Protein, ur: NEGATIVE mg/dL
RBC / HPF: >50 RBC/hpf — ABNORMAL HIGH (ref 0–5)
Specific Gravity, Urine: 1.025 (ref 1.005–1.030)
pH: 5 (ref 5.0–8.0)

## 2018-01-16 LAB — CBC
HCT: 34.7 % — ABNORMAL LOW (ref 35.0–47.0)
Hemoglobin: 11.6 g/dL — ABNORMAL LOW (ref 12.0–16.0)
MCH: 24.7 pg — ABNORMAL LOW (ref 26.0–34.0)
MCHC: 33.4 g/dL (ref 32.0–36.0)
MCV: 73.8 fL — ABNORMAL LOW (ref 80.0–100.0)
Platelets: 319 10*3/uL (ref 150–440)
RBC: 4.7 MIL/uL (ref 3.80–5.20)
RDW: 20.1 % — ABNORMAL HIGH (ref 11.5–14.5)
WBC: 12.9 10*3/uL — ABNORMAL HIGH (ref 3.6–11.0)

## 2018-01-16 LAB — LIPASE, BLOOD: LIPASE: 27 U/L (ref 11–51)

## 2018-01-16 LAB — POCT PREGNANCY, URINE: Preg Test, Ur: NEGATIVE

## 2018-01-16 MED ORDER — SODIUM CHLORIDE 0.9 % IV SOLN
1.0000 g | Freq: Once | INTRAVENOUS | Status: DC
  Filled 2018-01-16: qty 10

## 2018-01-16 MED ORDER — CEFTRIAXONE SODIUM 1 G IJ SOLR
INTRAMUSCULAR | Status: AC
  Filled 2018-01-16: qty 10

## 2018-01-16 MED ORDER — CEPHALEXIN 500 MG PO CAPS: 500.0000 mg | ORAL_CAPSULE | Freq: Three times a day (TID) | ORAL | 0 refills | Status: DC

## 2018-01-16 MED ORDER — SODIUM CHLORIDE 0.9 % IV BOLUS: 1000.0000 mL | Freq: Once | INTRAVENOUS | Status: DC

## 2018-01-16 MED ORDER — CEFTRIAXONE SODIUM 1 G IJ SOLR
1.0000 g | Freq: Once | INTRAMUSCULAR | Status: AC
Start: 2018-01-16 — End: 2018-01-16
  Administered 2018-01-16: 1 g via INTRAMUSCULAR

## 2018-01-16 NOTE — ED Notes (Signed)
Spoke with pt about wait times and what to expect next. Advised pt that I am available for further questions if needed.  

## 2018-01-16 NOTE — Discharge Instructions (Addendum)
Please seek medical attention for any high fevers, chest pain, shortness of breath, change in behavior, persistent vomiting, bloody stool or any other new or concerning symptoms.  

## 2018-01-16 NOTE — ED Notes (Signed)
First Nurse Note: Pt to ED via POV c/o left side pain and sore throat. Pt is in  NAD at this time.

## 2018-01-16 NOTE — ED Provider Notes (Signed)
[facility] Emergency Department Provider Note   ____________________________________________   I have reviewed the triage vital signs and the nursing notes.   HISTORY  Chief Complaint Left flank pain  History limited by: Not Limited   HPI Jennifer Wiggins is a 47 y.o. female who presents to the emergency department today with primary concern for left flank pain.  The patient states that the pain started 3 days ago.  She describes it as sharp.  It has been fairly constant.  Nothing she does makes it better or worse.  She has noticed some bad odor to her urine.  She denies any change in defecation.  No nausea vomiting or change in appetite. She does state that she is menstruating. In addition the patient has complaint of sore throat and sinus discomfort. These symptoms started yesterday. She has had associated cough. Denies any fevers.   Per medical record review patient has a history of uterine fibroid.  Past Medical History:  Diagnosis Date  . Uterine fibroid     There are no active problems to display for this patient.   Past Surgical History:  Procedure Laterality Date  . CESAREAN SECTION    . TUBAL LIGATION      Prior to Admission medications   Medication Sig Start Date End Date Taking? Authorizing Provider  erythromycin ophthalmic ointment Place 1 application into the left eye 4 (four) times daily. 04/07/17   Little, Traci M, PA-C  ketorolac (ACULAR) 0.5 % ophthalmic solution Place 1 drop into the right eye 3 (three) times daily as needed. 04/07/17   Little, Traci M, PA-C  meloxicam (MOBIC) 15 MG tablet Take 15 mg by mouth daily.    [provider]  naproxen (NAPROSYN) 500 MG tablet Take 1 tablet (500 mg total) by mouth 2 (two) times daily with a meal. 11/17/17   Sable Feil, PA-C  sertraline (ZOLOFT) 25 MG tablet Take 25 mg by mouth daily.    [provider]  traMADol (ULTRAM) 50 MG tablet Take 1 tablet (50 mg total) by mouth every  12 (twelve) hours as needed. 11/17/17   Sable Feil, PA-C    Allergies Latex  No family history on file.  Social History Social History   Tobacco Use  . Smoking status: Current Every Day Smoker    Packs/day: 0.50    Types: Cigarettes  . Smokeless tobacco: Never Used  Substance Use Topics  . Alcohol use: Not Currently  . Drug use: No    Review of Systems Constitutional: No fever/chills Eyes: No visual changes. ENT: Positive for sore throat.  Cardiovascular: Denies chest pain. Respiratory: Denies shortness of breath. Gastrointestinal: Positive for left flank pain. Genitourinary: Negative for dysuria. Positive for bad odor to her urine. Musculoskeletal: Negative for back pain. Skin: Negative for rash. Neurological: Negative for headaches, focal weakness or numbness.  ____________________________________________   PHYSICAL EXAM:  VITAL SIGNS: ED Triage Vitals  Enc Vitals Group     BP 01/16/18 1420 131/79     Pulse Rate 01/16/18 1420 77     Resp 01/16/18 1420 18     Temp 01/16/18 1420 98 F (36.7 C)     Temp Source 01/16/18 1420 Oral     SpO2 01/16/18 1420 100 %     Weight 01/16/18 1421 195 lb (88.5 kg)     Height 01/16/18 1421 5\' 5"  (1.651 m)     Head Circumference --      Peak Flow --  Pain Score 01/16/18 1421 8   Constitutional: Alert and oriented.  Eyes: Conjunctivae are normal.  ENT      Head: Normocephalic and atraumatic.      Nose: No congestion/rhinnorhea.      Mouth/Throat: Mucous membranes are moist. Mild erythema to tonsils, no exudates.       Neck: No stridor. Hematological/Lymphatic/Immunilogical: No cervical lymphadenopathy. Cardiovascular: Normal rate, regular rhythm.  No murmurs, rubs, or gallops.  Respiratory: Normal respiratory effort without tachypnea nor retractions. Breath sounds are clear and equal bilaterally. No wheezes/rales/rhonchi. Gastrointestinal: Soft and non tender. No rebound. No guarding.  Genitourinary:  Deferred Musculoskeletal: Normal range of motion in all extremities. No lower extremity edema. Neurologic:  Normal speech and language. No gross focal neurologic deficits are appreciated.  Skin:  Skin is warm, dry and intact. No rash noted. Psychiatric: Mood and affect are normal. Speech and behavior are normal. Patient exhibits appropriate insight and judgment.  ____________________________________________    LABS (pertinent positives/negatives)  Upreg negative Lipase 27 CMP wnl except glu 106, ca 8.7 CBC wbc 12.9, hgb 11.6, plt 319 UA hazy, large urine dipstick, negative nitrite, small leukocytes, rbc >50, wbc 6-10, bacteria rare ____________________________________________   EKG  None  ____________________________________________    RADIOLOGY  None  ____________________________________________   PROCEDURES  Procedures  ____________________________________________   INITIAL IMPRESSION / ASSESSMENT AND PLAN / ED COURSE  Pertinent labs & imaging results that were available during my care of the patient were reviewed by me and considered in my medical decision making (see chart for details).   Patient presented to the emergency department today because of concerns for some flank pain.  Also complaining bad odor to her urine.  Urine is consistent with urinary tract infection.  At this point I doubt kidney stone she states she is menstruating in the clinical story and exam is not consistent with kidney stone.  Will plan on treating with antibiotics.  Discussed findings and plan with patient.  Secondary complaint of sore throat is likely viral nature. ____________________________________________   FINAL CLINICAL IMPRESSION(S) / ED DIAGNOSES  Final diagnoses:  Lower urinary tract infectious disease     Note: This dictation was prepared with Dragon dictation. Any transcriptional errors that result from this process are unintentional     Jennifer Pear,  MD 01/16/18 1819

## 2018-01-16 NOTE — ED Notes (Signed)
IV attempt x 1 pt states she doesn't want to go thru all this can she "just get a shot"

## 2018-01-16 NOTE — ED Notes (Signed)
Pt upset. Felt she waited to long and didn't get the care she deserved. Allowed pt to express feeling. Reassurance given that I understand her frustration. Discharged home. Didn't sign for discharge. Took d/c papers and prescription

## 2018-01-16 NOTE — ED Triage Notes (Signed)
LLQ pain X 3 days. Sore throat x 1 day. Pt alert and oriented X4, active, cooperative, pt in NAD. RR even and unlabored, color WNL.

## 2018-01-31 DIAGNOSIS — Z23 Encounter for immunization: Secondary | ICD-10-CM | POA: Insufficient documentation

## 2018-01-31 DIAGNOSIS — S6982XA Other specified injuries of left wrist, hand and finger(s), initial encounter: Secondary | ICD-10-CM | POA: Insufficient documentation

## 2018-01-31 DIAGNOSIS — Y92008 Other place in unspecified non-institutional (private) residence as the place of occurrence of the external cause: Secondary | ICD-10-CM | POA: Insufficient documentation

## 2018-01-31 DIAGNOSIS — Y9389 Activity, other specified: Secondary | ICD-10-CM | POA: Insufficient documentation

## 2018-01-31 DIAGNOSIS — F17210 Nicotine dependence, cigarettes, uncomplicated: Secondary | ICD-10-CM | POA: Insufficient documentation

## 2018-01-31 DIAGNOSIS — Y999 Unspecified external cause status: Secondary | ICD-10-CM | POA: Insufficient documentation

## 2018-01-31 DIAGNOSIS — W458XXA Other foreign body or object entering through skin, initial encounter: Secondary | ICD-10-CM | POA: Insufficient documentation

## 2018-01-31 DIAGNOSIS — Z91040 Latex allergy status: Secondary | ICD-10-CM | POA: Insufficient documentation

## 2018-01-31 DIAGNOSIS — Z79899 Other long term (current) drug therapy: Secondary | ICD-10-CM | POA: Insufficient documentation

## 2018-02-01 ENCOUNTER — Emergency Department: Payer: Self-pay

## 2018-02-01 ENCOUNTER — Other Ambulatory Visit: Payer: Self-pay

## 2018-02-01 ENCOUNTER — Emergency Department
Admission: EM | Admit: 2018-02-01 | Discharge: 2018-02-01 | Disposition: A | Discharge status: Home / Self Care | Payer: Self-pay | Attending: Emergency Medicine | Admitting: Emergency Medicine

## 2018-02-01 ENCOUNTER — Encounter: Payer: Self-pay | Admitting: Emergency Medicine

## 2018-02-01 DIAGNOSIS — Z189 Retained foreign body fragments, unspecified material: Secondary | ICD-10-CM

## 2018-02-01 MED ORDER — FLUCONAZOLE 50 MG PO TABS
150.0000 mg | ORAL_TABLET | Freq: Once | ORAL | Status: AC
  Administered 2018-02-01: 150 mg via ORAL
  Filled 2018-02-01: qty 1

## 2018-02-01 MED ORDER — LIDOCAINE HCL (PF) 1 % IJ SOLN
5.0000 mL | Freq: Once | INTRAMUSCULAR | Status: AC
  Administered 2018-02-01: 5 mL via INTRADERMAL

## 2018-02-01 MED ORDER — TETANUS-DIPHTH-ACELL PERTUSSIS 5-2.5-18.5 LF-MCG/0.5 IM SUSP
0.5000 mL | Freq: Once | INTRAMUSCULAR | Status: AC
  Administered 2018-02-01: 0.5 mL via INTRAMUSCULAR
  Filled 2018-02-01: qty 0.5

## 2018-02-01 NOTE — ED Provider Notes (Signed)
[facility] Emergency Department Provider Note  ____________________________________________   First MD Initiated Contact with Patient 02/01/18 [phone]     (approximate)  I have reviewed the triage vital signs and the nursing notes.   HISTORY  Chief Complaint Hand Pain   HPI Jennifer Wiggins is a 47 y.o. female when who comes to the emergency department with 1 week of discomfort on the volar surface of her left hand.  She fell 1 week ago on outstretched hand and thinks that she had a piece of glass going to her left hand at the base of her left thumb.  She is attempted to pull it out multiple times at home however has been unsuccessful.  She is having increasing discomfort and comes to the emergency department today requesting help removing the foreign body.  She has mild to moderate aching discomfort worse when using her hand improved with rest.  No numbness or weakness.  Past Medical History:  Diagnosis Date  . Uterine fibroid     There are no active problems to display for this patient.   Past Surgical History:  Procedure Laterality Date  . CESAREAN SECTION    . TUBAL LIGATION      Prior to Admission medications   Medication Sig Start Date End Date Taking? Authorizing Provider  meloxicam (MOBIC) 15 MG tablet Take 15 mg by mouth daily.   Yes [provider]  naproxen (NAPROSYN) 500 MG tablet Take 1 tablet (500 mg total) by mouth 2 (two) times daily with a meal. 11/17/17  Yes Sable Feil, PA-C  sertraline (ZOLOFT) 25 MG tablet Take 25 mg by mouth daily.   Yes [provider]  traMADol (ULTRAM) 50 MG tablet Take 1 tablet (50 mg total) by mouth every 12 (twelve) hours as needed. 11/17/17  Yes Sable Feil, PA-C    Allergies Latex  History reviewed. No pertinent family history.  Social History Social History   Tobacco Use  . Smoking status: Current Every Day Smoker    Packs/day: 0.50    Types: Cigarettes  . Smokeless tobacco:  Never Used  Substance Use Topics  . Alcohol use: Never    Frequency: Never  . Drug use: Never    Review of Systems Constitutional: No fever/chills ENT: No sore throat. Cardiovascular: Denies chest pain. Respiratory: Denies shortness of breath. Musculoskeletal: As of her hand pain Neurological: Negative for headaches   ____________________________________________   PHYSICAL EXAM:  VITAL SIGNS: ED Triage Vitals  Enc Vitals Group     BP 02/01/18 0027 136/87     Pulse Rate 02/01/18 0027 72     Resp 02/01/18 0027 16     Temp 02/01/18 0027 98.4 F (36.9 C)     Temp Source 02/01/18 0027 Oral     SpO2 02/01/18 0027 100 %     Weight 02/01/18 0028 195 lb (88.5 kg)     Height 02/01/18 0028 5\' 5"  (1.651 m)     Head Circumference --      Peak Flow --      Pain Score 02/01/18 0028 7     Pain Loc --      Pain Edu? --      Excl. in [place]? --     Constitutional: Alert and oriented x4 pleasant cooperative no distress Head: Atraumatic. Nose: No congestion/rhinnorhea. Mouth/Throat: No trismus Neck: No stridor.   Cardiovascular: Regular rate and rhythm Respiratory: Normal respiratory effort.  No retractions. MSK: No tenderness over distal  radius or distal ulna. No tenderness over snuffbox and no axial load discomfort Sensation intact to light touch over first dorsal webspace, distal index finger, distal small finger Can flex and oppose  thumb, cross 2 on 3, and extend wrist 2+ radial pulse and less than 2 second capillary refill Skin is closed Compartments are soft At the base of the left thumb on the volar surface slightly towards the index finger side is a small raised bump that is uncomfortable Neurologic:  Normal speech and language. No gross focal neurologic deficits are appreciated.  Skin:  Skin is warm, dry and intact. No rash noted.    ____________________________________________  LABS (all labs ordered are listed, but only abnormal results are displayed)  Labs  Reviewed - No data to display   __________________________________________  EKG   ____________________________________________  RADIOLOGY  X-ray of the left hand reviewed by me concerning for 2 foreign bodies ____________________________________________   DIFFERENTIAL includes but not limited to  Foreign body, fracture, laceration   PROCEDURES  Procedure(s) performed: Yes  .Marland KitchenLaceration Repair Date/Time: 02/01/2018 3:00 AM Performed by: Darel Hong, MD Authorized by: Darel Hong, MD   Consent:    Consent obtained:  Verbal   Consent given by:  Patient   Risks discussed:  Infection, pain, retained foreign body, poor cosmetic result and poor wound healing Anesthesia (see MAR for exact dosages):    Anesthesia method:  Local infiltration   Local anesthetic:  Lidocaine 1% w/o epi Laceration details:    Location:  Hand   Hand location:  L palm   Length (cm):  1 Repair type:    Repair type:  Simple Pre-procedure details:    Preparation:  Patient was prepped and draped in usual sterile fashion and imaging obtained to evaluate for foreign bodies Exploration:    Hemostasis achieved with:  Direct pressure   Wound exploration: entire depth of wound probed and visualized     Contaminated: no   Treatment:    Area cleansed with:  Saline   Amount of cleaning:  Extensive   Irrigation solution:  Sterile saline   Visualized foreign bodies/material removed: no   Skin repair:    Repair method:  Sutures   Suture size:  5-0   Suture material:  Nylon   Number of sutures:  2 Approximation:    Approximation:  Close Post-procedure details:    Dressing:  Sterile dressing   Patient tolerance of procedure:  Tolerated well, no immediate complications    Critical Care performed: no  Observation: no ____________________________________________   INITIAL IMPRESSION / ASSESSMENT AND PLAN / ED COURSE  Pertinent labs & imaging results that were available during my care of  the patient were reviewed by me and considered in my medical decision making (see chart for details).  The patient arrives neuro intact and well-appearing.  X-ray confirms what appears to be 2 foreign bodies.  One is in her pinky finger although this is not irritating her and the other would be at the base of her left thumb where she notes discomfort.  I had a lengthy discussion with the patient regarding the dangers of exploration and and that her foreign body is over the recurrent median nerve.  I said I would be happy to make a small incision and explore gently, however it would not be a deep exploration and I did not feel that I would be successful.  She preferred that I try.  I made a nick incision with an 11 blade and then  fell gently with forceps and a Q-tip and was unable to note any foreign body.  Wound was then washed out and closed with 2 sutures.  She is discharged home in stable condition verbalizes understanding and agreeable to plan.      ____________________________________________   FINAL CLINICAL IMPRESSION(S) / ED DIAGNOSES  Final diagnoses:  Retained foreign body      NEW MEDICATIONS STARTED DURING THIS VISIT:  Discharge Medication List as of 02/01/2018  4:07 AM       Note:  This document was prepared using Dragon voice recognition software and may include unintentional dictation errors.      Darel Hong, MD 02/01/18 [phone]

## 2018-02-01 NOTE — Discharge Instructions (Signed)
It was a pleasure to take care of you today, and thank you for coming to our emergency department.  If you have any questions or concerns before leaving please ask the nurse to grab me and I'm more than happy to go through your aftercare instructions again.  If you were prescribed any opioid pain medication today such as Norco, Vicodin, Percocet, morphine, hydrocodone, or oxycodone please make sure you do not drive when you are taking this medication as it can alter your ability to drive safely.  If you have any concerns once you are home that you are not improving or are in fact getting worse before you can make it to your follow-up appointment, please do not hesitate to call 911 and come back for further evaluation.  Darel Hong, MD  Results for orders placed or performed during the hospital encounter of 01/16/18  Lipase, blood  Result Value Ref Range   Lipase 27 11 - 51 U/L  Comprehensive metabolic panel  Result Value Ref Range   Sodium 138 135 - 145 mmol/L   Potassium 4.3 3.5 - 5.1 mmol/L   Chloride 110 101 - 111 mmol/L   CO2 23 22 - 32 mmol/L   Glucose, Bld 106 (H) 65 - 99 mg/dL   BUN 19 6 - 20 mg/dL   Creatinine, Ser 0.97 0.44 - 1.00 mg/dL   Calcium 8.7 (L) 8.9 - 10.3 mg/dL   Total Protein 7.2 6.5 - 8.1 g/dL   Albumin 3.8 3.5 - 5.0 g/dL   AST 23 15 - 41 U/L   ALT 15 14 - 54 U/L   Alkaline Phosphatase 100 38 - 126 U/L   Total Bilirubin 0.4 0.3 - 1.2 mg/dL   GFR calc non Af Amer >60 >60 mL/min   GFR calc Af Amer >60 >60 mL/min   Anion gap 5 5 - 15  CBC  Result Value Ref Range   WBC 12.9 (H) 3.6 - 11.0 K/uL   RBC 4.70 3.80 - 5.20 MIL/uL   Hemoglobin 11.6 (L) 12.0 - 16.0 g/dL   HCT 34.7 (L) 35.0 - 47.0 %   MCV 73.8 (L) 80.0 - 100.0 fL   MCH 24.7 (L) 26.0 - 34.0 pg   MCHC 33.4 32.0 - 36.0 g/dL   RDW 20.1 (H) 11.5 - 14.5 %   Platelets 319 150 - 440 K/uL  Urinalysis, Complete w Microscopic  Result Value Ref Range   Color, Urine YELLOW (A) YELLOW   APPearance HAZY (A)  CLEAR   Specific Gravity, Urine 1.025 1.005 - 1.030   pH 5.0 5.0 - 8.0   Glucose, UA NEGATIVE NEGATIVE mg/dL   Hgb urine dipstick LARGE (A) NEGATIVE   Bilirubin Urine NEGATIVE NEGATIVE   Ketones, ur 5 (A) NEGATIVE mg/dL   Protein, ur NEGATIVE NEGATIVE mg/dL   Nitrite NEGATIVE NEGATIVE   Leukocytes, UA SMALL (A) NEGATIVE   RBC / HPF >50 (H) 0 - 5 RBC/hpf   WBC, UA 6-10 0 - 5 WBC/hpf   Bacteria, UA RARE (A) NONE SEEN   Squamous Epithelial / LPF 0-5 0 - 5   Mucus PRESENT   Pregnancy, urine POC  Result Value Ref Range   Preg Test, Ur NEGATIVE NEGATIVE   Dg Hand Complete Left  Result Date: 02/01/2018 CLINICAL DATA:  Fall with suspected foreign body EXAM: LEFT HAND - COMPLETE 3+ VIEW COMPARISON:  None. FINDINGS: No fracture or malalignment. 3 mm linear radiopaque possible foreign body volar aspect of the thumb at the level  of the mid distal phalanx. There may be punctate opacity overlying the thenar eminence. This is not confirmed on the additional views. IMPRESSION: 1. No fracture or malalignment 2. Probable 3 mm linear foreign body within the distal thumb. There are punctate opacities overlying the web between the first and second digit, questionable for punctate foreign body as this is seen on only one view. Electronically Signed   By: Donavan Foil M.D.   On: 02/01/2018 01:55

## 2018-02-01 NOTE — ED Notes (Signed)
Discharge instructions reviewed with patient. Questions fielded by this RN. Patient verbalizes understanding of instructions. Patient discharged home in stable condition per Dr Mable Paris. No acute distress noted at time of discharge.

## 2018-02-01 NOTE — ED Triage Notes (Addendum)
Pt says she fell about a week ago and thinks she still has a piece of glass stuck in her left palm; also c/o pain to 5th digit of same hand; pt in no acute distress; pt adds last time she was here she was prescribed antibiotics for UTI and thinks she now has a yeast infection; would like a prescription for that while she's here

## 2018-02-01 NOTE — ED Notes (Signed)
ED Provider at bedside.  Pt reports falling off porch and landing on glass, removed some at time, now reports still having glass in left medial palm, CMS intact, discomforting pain, pt wants FOB removed

## 2019-05-13 ENCOUNTER — Emergency Department
Admission: EM | Admit: 2019-05-13 | Discharge: 2019-05-13 | Disposition: A | Discharge status: Home / Self Care | Payer: Self-pay | Attending: Emergency Medicine | Admitting: Emergency Medicine

## 2019-05-13 ENCOUNTER — Other Ambulatory Visit: Payer: Self-pay

## 2019-05-13 DIAGNOSIS — M5431 Sciatica, right side: Secondary | ICD-10-CM

## 2019-05-13 DIAGNOSIS — F17210 Nicotine dependence, cigarettes, uncomplicated: Secondary | ICD-10-CM | POA: Insufficient documentation

## 2019-05-13 DIAGNOSIS — Z79899 Other long term (current) drug therapy: Secondary | ICD-10-CM | POA: Insufficient documentation

## 2019-05-13 MED ORDER — KETOROLAC TROMETHAMINE 30 MG/ML IJ SOLN
30.0000 mg | Freq: Once | INTRAMUSCULAR | Status: AC
  Administered 2019-05-13: 30 mg via INTRAMUSCULAR
  Filled 2019-05-13: qty 1

## 2019-05-13 MED ORDER — CYCLOBENZAPRINE HCL 5 MG PO TABS: ORAL_TABLET | ORAL | 0 refills | Status: DC

## 2019-05-13 MED ORDER — CYCLOBENZAPRINE HCL 10 MG PO TABS: 5.0000 mg | ORAL_TABLET | Freq: Once | ORAL | Status: DC

## 2019-05-13 MED ORDER — OXYCODONE-ACETAMINOPHEN 5-325 MG PO TABS: 1.0000 | ORAL_TABLET | ORAL | 0 refills | Status: DC | PRN

## 2019-05-13 MED ORDER — OXYCODONE-ACETAMINOPHEN 5-325 MG PO TABS: 1.0000 | ORAL_TABLET | Freq: Once | ORAL | Status: DC

## 2019-05-13 NOTE — ED Triage Notes (Signed)
Pt with co right posterior leg pain for 2 months, states worse when she wakes up in the am.

## 2019-05-13 NOTE — Discharge Instructions (Signed)
1.  Continue your Meloxicam daily. 2.  You may take Percocet as needed for pain and Flexeril as needed for muscle spasms. 3.  Return to the ER for worsening symptoms, persistent vomiting, difficulty breathing, losing control of your bowel or bladder, or other concerns.

## 2019-05-13 NOTE — ED Provider Notes (Addendum)
[facility] Emergency Department Provider Note   ____________________________________________   First MD Initiated Contact with Patient 05/13/19 0507     (approximate)  I have reviewed the triage vital signs and the nursing notes.   HISTORY  Chief Complaint Leg Pain    HPI Jennifer Wiggins is a 48 y.o. female who presents to the ED from home with a chief complaint of right posterior leg pain x2 months.  Patient states pain is worse in the mornings when she is trying to get out of bed.  Feels stiff with pain radiating from the back of her right buttock all the way down to her toes.  Denies trauma when the pain originally started.  Denies extremity weakness, numbness/tingling, bowel or bladder incontinence.  Denies fever, cough, chest pain, shortness of breath, abdominal pain, nausea, vomiting or diarrhea.  Denies anticoagulant use.       Past Medical History:  Diagnosis Date  . Uterine fibroid     There are no active problems to display for this patient.   Past Surgical History:  Procedure Laterality Date  . CESAREAN SECTION    . TUBAL LIGATION      Prior to Admission medications   Medication Sig Start Date End Date Taking? Authorizing Provider  cyclobenzaprine (FLEXERIL) 5 MG tablet 1 tablet every 8 hours as he did for muscle spasms 05/13/19   Paulette Blanch, MD  meloxicam (MOBIC) 15 MG tablet Take 15 mg by mouth daily.    [provider]  naproxen (NAPROSYN) 500 MG tablet Take 1 tablet (500 mg total) by mouth 2 (two) times daily with a meal. 11/17/17   Sable Feil, PA-C  oxyCODONE-acetaminophen (PERCOCET/ROXICET) 5-325 MG tablet Take 1 tablet by mouth every 4 (four) hours as needed for severe pain. 05/13/19   Paulette Blanch, MD  sertraline (ZOLOFT) 25 MG tablet Take 25 mg by mouth daily.    [provider]  traMADol (ULTRAM) 50 MG tablet Take 1 tablet (50 mg total) by mouth every 12 (twelve) hours as needed. 11/17/17   Sable Feil,  PA-C    Allergies Latex  No family history on file.  Social History Social History   Tobacco Use  . Smoking status: Current Every Day Smoker    Packs/day: 0.50    Types: Cigarettes  . Smokeless tobacco: Never Used  Substance Use Topics  . Alcohol use: Never    Frequency: Never  . Drug use: Never    Review of Systems  Constitutional: No fever/chills Eyes: No visual changes. ENT: No sore throat. Cardiovascular: Denies chest pain. Respiratory: Denies shortness of breath. Gastrointestinal: No abdominal pain.  No nausea, no vomiting.  No diarrhea.  No constipation. Genitourinary: Negative for dysuria. Musculoskeletal: Positive for right buttock to leg pain. Skin: Negative for rash. Neurological: Negative for headaches, focal weakness or numbness.   ____________________________________________   PHYSICAL EXAM:  VITAL SIGNS: ED Triage Vitals  Enc Vitals Group     BP 05/13/19 0502 128/83     Pulse Rate 05/13/19 0502 65     Resp 05/13/19 0502 18     Temp 05/13/19 0502 97.9 F (36.6 C)     Temp Source 05/13/19 0502 Oral     SpO2 05/13/19 0502 100 %     Weight 05/13/19 0454 190 lb (86.2 kg)     Height 05/13/19 0454 5\' 4"  (1.626 m)     Head Circumference --      Peak Flow --  Pain Score 05/13/19 0454 10     Pain Loc --      Pain Edu? --      Excl. in [place]? --     Constitutional: Alert and oriented. Well appearing and in mild acute distress. Eyes: Conjunctivae are normal. PERRL. EOMI. Head: Atraumatic. Nose: No congestion/rhinnorhea. Mouth/Throat: Mucous membranes are moist.  Oropharynx non-erythematous. Neck: No stridor.   Cardiovascular: Normal rate, regular rhythm. Grossly normal heart sounds.  Good peripheral circulation. Respiratory: Normal respiratory effort.  No retractions. Lungs CTAB. Gastrointestinal: Soft and nontender. No distention. No abdominal bruits. No CVA tenderness. Musculoskeletal: No spinal tenderness to palpation.  Right buttock tender  to palpation.  Negative straight leg raise.  No calf pain.  2+ distal pulses.  Brisk, less than 5-second capillary refill. Neurologic:  Normal speech and language. No gross focal neurologic deficits are appreciated.  Skin:  Skin is warm, dry and intact. No rash noted. Psychiatric: Mood and affect are normal. Speech and behavior are normal.  ____________________________________________   LABS (all labs ordered are listed, but only abnormal results are displayed)  Labs Reviewed - No data to display ____________________________________________  EKG  None ____________________________________________  RADIOLOGY  ED MD interpretation: None  Official radiology report(s): No results found.  ____________________________________________   PROCEDURES  Procedure(s) performed (including Critical Care):  Procedures   ____________________________________________   INITIAL IMPRESSION / ASSESSMENT AND PLAN / ED COURSE  As part of my medical decision making, I reviewed the following data within the [facility] notes reviewed and incorporated, Old chart reviewed, Notes from prior ED visits and [place] Controlled Substance Database     Jennifer Wiggins was evaluated in Emergency Department on 05/13/2019 for the symptoms described in the history of present illness. She was evaluated in the context of the global COVID-19 pandemic, which necessitated consideration that the patient might be at risk for infection with the SARS-CoV-2 virus that causes COVID-19. Institutional protocols and algorithms that pertain to the evaluation of patients at risk for COVID-19 are in a state of rapid change based on information released by regulatory bodies including the CDC and federal and state organizations. These policies and algorithms were followed during the patient's care in the ED.    48 year old female who presents with right-sided sciatica.  She is currently taking meloxicam daily.   Will add Percocet for pain, Flexeril for muscle spasms.  Patient declined steroids as they caused shingles in the past.  Strict return precautions given.  Patient verbalizes understanding and agrees with plan of care.      ____________________________________________   FINAL CLINICAL IMPRESSION(S) / ED DIAGNOSES  Final diagnoses:  Sciatica of right side     ED Discharge Orders         Ordered    cyclobenzaprine (FLEXERIL) 5 MG tablet     05/13/19 0517    oxyCODONE-acetaminophen (PERCOCET/ROXICET) 5-325 MG tablet  Every 4 hours PRN     05/13/19 0517           Note:  This document was prepared using Dragon voice recognition software and may include unintentional dictation errors.   Paulette Blanch, MD 05/13/19 [id number]    Paulette Blanch, MD 05/13/19 [phone]

## 2019-05-25 ENCOUNTER — Encounter: Payer: Self-pay | Admitting: *Deleted

## 2019-05-25 ENCOUNTER — Emergency Department
Admission: EM | Admit: 2019-05-25 | Discharge: 2019-05-25 | Disposition: A | Discharge status: Home / Self Care | Payer: Self-pay | Attending: Emergency Medicine | Admitting: Emergency Medicine

## 2019-05-25 ENCOUNTER — Other Ambulatory Visit: Payer: Self-pay

## 2019-05-25 DIAGNOSIS — R0989 Other specified symptoms and signs involving the circulatory and respiratory systems: Secondary | ICD-10-CM | POA: Insufficient documentation

## 2019-05-25 DIAGNOSIS — Z5321 Procedure and treatment not carried out due to patient leaving prior to being seen by health care provider: Secondary | ICD-10-CM | POA: Insufficient documentation

## 2019-05-25 MED ORDER — ALUM & MAG HYDROXIDE-SIMETH 200-200-20 MG/5ML PO SUSP: 30.0000 mL | Freq: Once | ORAL | Status: DC

## 2019-05-25 MED ORDER — LIDOCAINE VISCOUS HCL 2 % MT SOLN: 15.0000 mL | Freq: Once | OROMUCOSAL | Status: DC

## 2019-05-25 NOTE — ED Notes (Signed)
Pt left without taking meds that dr Kerman Passey verbally ordered.

## 2019-05-25 NOTE — ED Triage Notes (Signed)
Pt reports taking abx and states it feels like something is stuck in chest/throat.  Sx for 2 days.  No resp distress.  No diff eating or drinking.  Pt alert  Speech clear.

## 2019-05-25 NOTE — ED Notes (Signed)
Patient came up to front desk and states "take me out I am leaving" PAtient asked to stay by this RN and refused

## 2019-05-25 NOTE — ED Notes (Signed)
Pt able to speak in full, complete sentences; no breathing difficulty noted at this time.

## 2019-06-24 ENCOUNTER — Telehealth: Payer: Self-pay | Admitting: Pharmacy Technician

## 2019-06-24 NOTE — Telephone Encounter (Signed)
Received 2020 proof of income.  Patient eligible to receive medication assistance at [facility] as long as eligibility requirements continue to be met.  [facility]

## 2019-06-27 ENCOUNTER — Emergency Department
Admission: EM | Admit: 2019-06-27 | Discharge: 2019-06-27 | Discharge status: Against Medical Advice | Payer: Self-pay | Attending: Emergency Medicine | Admitting: Emergency Medicine

## 2019-06-27 ENCOUNTER — Other Ambulatory Visit: Payer: Self-pay

## 2019-06-27 ENCOUNTER — Encounter: Payer: Self-pay | Admitting: Emergency Medicine

## 2019-06-27 DIAGNOSIS — M545 Low back pain: Secondary | ICD-10-CM | POA: Insufficient documentation

## 2019-06-27 DIAGNOSIS — M5440 Lumbago with sciatica, unspecified side: Secondary | ICD-10-CM

## 2019-06-27 DIAGNOSIS — F17210 Nicotine dependence, cigarettes, uncomplicated: Secondary | ICD-10-CM | POA: Insufficient documentation

## 2019-06-27 NOTE — ED Notes (Signed)
See triage note  Presents with lower back pain which is moving into right leg  Hx of sciatica  Denies any recent injury

## 2019-06-27 NOTE — ED Notes (Signed)
Pt inquiring about the wait time; when explained to patient [facility] would be opening at 7am and she'd be taken back there, pt became angry and said she "ain't waiting that long"; pt is still sitting in wheelchair.Marland KitchenMarland Kitchen

## 2019-06-27 NOTE — ED Triage Notes (Signed)
Pt reports pain in her right buttock that radiates down the back of her right leg since the beginning of October; has been here and [facility]; has appt with ortho MD on 07/06/19

## 2019-06-27 NOTE — ED Notes (Signed)
Pt became upset after provider went in to examine pt  States she was not staying    Pt left the room cussing

## 2019-06-27 NOTE — ED Provider Notes (Signed)
[facility] Emergency Department Provider Note  ____________________________________________  Time seen: Approximately 7:14 AM  I have reviewed the triage vital signs and the nursing notes.   HISTORY  Chief Complaint Leg Pain    HPI Jennifer Wiggins is a 48 y.o. female that presents to the emergency department for evaluation of low back pain that radiates into her leg for 2 months.  Patient states that she had an appointment scheduled for follow-up but it was canceled and rescheduled for November 24.  Patient states that she is frustrated from coming back to the emergency department several times without an answer for what is causing her sciatica.  She most recently went to [facility] 1 month ago and was given naproxen, which does not help.  She was given Flexeril and Percocet here initially, which did help.  Patient states that she has been wearing a maxi pad, but will not specify whether she is wearing this because she is unable to control her bladder or because she is unable to get to the bathroom quick enough due to pain.  Patient does not comment whether she has dysuria, urgency, frequency because she states that she does not want to be evaluated for urinary symptoms.  She does say that her primary care provider did a urinalysis this week, and she has not received results.  No bowel dysfunction.  Patient declines participating in any additional questions and would just like an x-ray ordered.   Past Medical History:  Diagnosis Date  . Uterine fibroid     There are no active problems to display for this patient.   Past Surgical History:  Procedure Laterality Date  . CESAREAN SECTION    . TUBAL LIGATION      Prior to Admission medications   Medication Sig Start Date End Date Taking? Authorizing Provider  sertraline (ZOLOFT) 25 MG tablet Take 25 mg by mouth daily.    [provider]    Allergies Latex  History reviewed. No pertinent family  history.  Social History Social History   Tobacco Use  . Smoking status: Current Every Day Smoker    Packs/day: 0.25    Types: Cigarettes  . Smokeless tobacco: Never Used  Substance Use Topics  . Alcohol use: Never    Frequency: Never  . Drug use: Never     Review of Systems  Respiratory: No SOB. Gastrointestinal: No nausea, no vomiting.  Musculoskeletal: Positive for back pain. Skin: Negative for rash, abrasions, lacerations, ecchymosis. Neurological: Negative for headaches   ____________________________________________   PHYSICAL EXAM:  VITAL SIGNS: ED Triage Vitals  Enc Vitals Group     BP 06/27/19 0507 126/86     Pulse Rate 06/27/19 0507 66     Resp 06/27/19 0507 16     Temp 06/27/19 0507 98 F (36.7 C)     Temp Source 06/27/19 0507 Oral     SpO2 06/27/19 0507 100 %     Weight 06/27/19 0508 189 lb (85.7 kg)     Height 06/27/19 0508 5\' 4"  (1.626 m)     Head Circumference --      Peak Flow --      Pain Score 06/27/19 0508 10     Pain Loc --      Pain Edu? --      Excl. in [place]? --      Constitutional: Alert and oriented. Well appearing and in no acute distress. Eyes: Conjunctivae are normal. PERRL. EOMI. Head: Atraumatic. ENT:  Ears:      Nose: No congestion/rhinnorhea.      Mouth/Throat: Mucous membranes are moist.  Neck: No stridor.   Cardiovascular:  Good peripheral circulation. Respiratory: Normal respiratory effort without tachypnea or retractions.  Musculoskeletal: Full range of motion to all extremities. No gross deformities appreciated.  Weightbearing.  Quick but antalgic gait. Neurologic:  Normal speech and language. No gross focal neurologic deficits are appreciated.  Skin:  Skin is warm, dry and intact. No rash noted. Psychiatric: Mood and affect are normal. Speech and behavior are normal. Patient exhibits appropriate insight and judgement.   ____________________________________________   LABS (all labs ordered are listed, but only  abnormal results are displayed)  Labs Reviewed - No data to display ____________________________________________  EKG   ____________________________________________  RADIOLOGY   No results found.  ____________________________________________    PROCEDURES  Procedure(s) performed:    Procedures    Medications - No data to display   ____________________________________________   INITIAL IMPRESSION / ASSESSMENT AND PLAN / ED COURSE  Pertinent labs & imaging results that were available during my care of the patient were reviewed by me and considered in my medical decision making (see chart for details).  Review of the [place] CSRS was performed in accordance of the [place] prior to dispensing any controlled drugs.  Patient presents to the emergency department for evaluation of continued back pain for 2 months.  Patient is frustrated and upset in the emergency department with the wait in the waiting room and for her several visits in the ED over the last 2 months.  Patient states that she has had some urinary dysfunction but will not comment any additional information on this subject to whether she is unable to control her urine or whether she is in too much back pain to make it to the bathroom on time.  I expressed that this distinction is important because if she is having urinary dysfunction, that she would need an MRI to rule out a spinal cord compression.  Risks of permanent incontinence and permanent damage were discussed with the patient, which she acknowledged and she becomes agitated and upset only requesting an x-ray.  I told patient that I would order an initial x-ray but that I still feel that she needs an MRI if I cannot get additional clarity on her urinary dysfunction.  Initial x-ray was ordered and patient eloped, telling nursing staff that she was leaving.    Jennifer Wiggins was evaluated in Emergency Department on 06/27/2019 for the symptoms described in the history of  present illness. She was evaluated in the context of the global COVID-19 pandemic, which necessitated consideration that the patient might be at risk for infection with the SARS-CoV-2 virus that causes COVID-19. Institutional protocols and algorithms that pertain to the evaluation of patients at risk for COVID-19 are in a state of rapid change based on information released by regulatory bodies including the CDC and federal and state organizations. These policies and algorithms were followed during the patient's care in the ED.     ____________________________________________  FINAL CLINICAL IMPRESSION(S) / ED DIAGNOSES  Final diagnoses:  None      NEW MEDICATIONS STARTED DURING THIS VISIT:  ED Discharge Orders    None          This chart was dictated using voice recognition software/Dragon. Despite best efforts to proofread, errors can occur which can change the meaning. Any change was purely unintentional.    Laban Emperor, PA-C 06/27/19 1553  Earleen Newport, MD 06/28/19 [phone]

## 2019-08-02 ENCOUNTER — Encounter: Payer: Self-pay | Admitting: *Deleted

## 2019-08-02 ENCOUNTER — Ambulatory Visit
Admission: EM | Admit: 2019-08-02 | Discharge: 2019-08-02 | Disposition: A | Discharge status: Home / Self Care | Payer: Self-pay | Attending: Emergency Medicine | Admitting: Emergency Medicine

## 2019-08-02 DIAGNOSIS — H6692 Otitis media, unspecified, left ear: Secondary | ICD-10-CM

## 2019-08-02 MED ORDER — AMOXICILLIN 500 MG PO TABS: 500.0000 mg | ORAL_TABLET | Freq: Three times a day (TID) | ORAL | 0 refills | Status: DC

## 2019-08-02 NOTE — ED Triage Notes (Signed)
Patient reports 4 week history of bilateral ear pain, states pain moves from ear to ear. Patient reports pain associated with headache.

## 2019-08-02 NOTE — Discharge Instructions (Addendum)
Take the amoxicillin as directed.  Take Tylenol or ibuprofen as needed for discomfort.    Follow up with your primary care provider if your symptoms are not improving.     

## 2019-08-02 NOTE — ED Provider Notes (Signed)
[name]    CSN: [id number] Arrival date & time: 08/02/19  1509      History   Chief Complaint Chief Complaint  Patient presents with  . Otalgia    HPI Jennifer Wiggins is a 48 y.o. female.   Patient presents with bilateral ear pain x4 weeks, R>L.  She also reports headache.  She denies fever, chills, sore throat, cough, congestion, shortness of breath, vomiting, diarrhea, rash, or other symptoms.  No treatments attempted at home.  The history is provided by the patient.    Past Medical History:  Diagnosis Date  . Uterine fibroid     There are no problems to display for this patient.   Past Surgical History:  Procedure Laterality Date  . CESAREAN SECTION    . TUBAL LIGATION      OB History    Gravida  5   Para      Term      Preterm      AB  1   Living  3     SAB  1   TAB      Ectopic      Multiple      Live Births               Home Medications    Prior to Admission medications   Medication Sig Start Date End Date Taking? Authorizing Provider  amoxicillin (AMOXIL) 500 MG tablet Take 1 tablet (500 mg total) by mouth 3 (three) times daily. 08/02/19   Sharion Balloon, NP  sertraline (ZOLOFT) 25 MG tablet Take 25 mg by mouth daily.    [provider]    Family History History reviewed. No pertinent family history.  Social History Social History   Tobacco Use  . Smoking status: Current Every Day Smoker    Packs/day: 0.25    Types: Cigarettes  . Smokeless tobacco: Never Used  Substance Use Topics  . Alcohol use: Never  . Drug use: Never     Allergies   Latex   Review of Systems Review of Systems  Constitutional: Negative for chills and fever.  HENT: Positive for ear pain. Negative for congestion, rhinorrhea and sore throat.   Eyes: Negative for pain and visual disturbance.  Respiratory: Negative for cough and shortness of breath.   Cardiovascular: Negative for chest pain and palpitations.    Gastrointestinal: Negative for abdominal pain, diarrhea, nausea and vomiting.  Genitourinary: Negative for dysuria and hematuria.  Musculoskeletal: Negative for arthralgias and back pain.  Skin: Negative for color change and rash.  Neurological: Negative for seizures and syncope.  All other systems reviewed and are negative.    Physical Exam Triage Vital Signs ED Triage Vitals  Enc Vitals Group     BP      Pulse      Resp      Temp      Temp src      SpO2      Weight      Height      Head Circumference      Peak Flow      Pain Score      Pain Loc      Pain Edu?      Excl. in [place]?    No data found.  Updated Vital Signs BP 122/85 (BP Location: Left Arm)   Pulse 75   Temp 98.4 F (36.9 C) (Oral)   Resp 18   LMP 05/13/2019  SpO2 97%   Visual Acuity Right Eye Distance:   Left Eye Distance:   Bilateral Distance:    Right Eye Near:   Left Eye Near:    Bilateral Near:     Physical Exam Vitals and nursing note reviewed.  Constitutional:      General: She is not in acute distress.    Appearance: She is well-developed.  HENT:     Head: Normocephalic and atraumatic.     Right Ear: Ear canal normal. Tympanic membrane is erythematous.     Left Ear: Ear canal normal. Tympanic membrane is not erythematous.     Nose: Nose normal.     Mouth/Throat:     Mouth: Mucous membranes are moist.     Pharynx: Oropharynx is clear.  Eyes:     Conjunctiva/sclera: Conjunctivae normal.  Cardiovascular:     Rate and Rhythm: Normal rate and regular rhythm.     Heart sounds: No murmur.  Pulmonary:     Effort: Pulmonary effort is normal. No respiratory distress.     Breath sounds: Normal breath sounds.  Abdominal:     General: Bowel sounds are normal.     Palpations: Abdomen is soft.     Tenderness: There is no abdominal tenderness. There is no guarding or rebound.  Musculoskeletal:     Cervical back: Neck supple.  Skin:    General: Skin is warm and dry.     Findings: No  rash.  Neurological:     General: No focal deficit present.     Mental Status: She is alert and oriented to person, place, and time.  Psychiatric:        Mood and Affect: Mood normal.        Behavior: Behavior normal.      UC Treatments / Results  Labs (all labs ordered are listed, but only abnormal results are displayed) Labs Reviewed - No data to display  EKG   Radiology No results found.  Procedures Procedures (including critical care time)  Medications Ordered in UC Medications - No data to display  Initial Impression / Assessment and Plan / UC Course  I have reviewed the triage vital signs and the nursing notes.  Pertinent labs & imaging results that were available during my care of the patient were reviewed by me and considered in my medical decision making (see chart for details).   Left otitis media.  Treating with amoxicillin.  Instructed patient to take Tylenol or ibuprofen as needed for discomfort.  Instructed her to follow-up with her PCP if her symptoms are not improving.  Patient agrees to plan of care.      Final Clinical Impressions(s) / UC Diagnoses   Final diagnoses:  Left otitis media, unspecified otitis media type     Discharge Instructions     Take the amoxicillin as directed.    Take Tylenol or ibuprofen as needed for discomfort.    Follow-up with your primary care provider if your symptoms are not improving.        ED Prescriptions    Medication Sig Dispense Auth. Provider   amoxicillin (AMOXIL) 500 MG tablet  (Status: Discontinued) Take 1 tablet (500 mg total) by mouth 3 (three) times daily. 21 tablet Sharion Balloon, NP   amoxicillin (AMOXIL) 500 MG tablet  (Status: Discontinued) Take 1 tablet (500 mg total) by mouth 3 (three) times daily. 21 tablet Sharion Balloon, NP   amoxicillin (AMOXIL) 500 MG tablet  (Status: Discontinued) Take 1 tablet (  500 mg total) by mouth 3 (three) times daily. 21 tablet Sharion Balloon, NP   amoxicillin  (AMOXIL) 500 MG tablet Take 1 tablet (500 mg total) by mouth 3 (three) times daily. 21 tablet Sharion Balloon, NP     I have reviewed the PDMP during this encounter.   Sharion Balloon, NP 08/02/19 1610

## 2019-08-10 ENCOUNTER — Ambulatory Visit: Payer: Self-pay

## 2019-08-10 ENCOUNTER — Other Ambulatory Visit: Payer: Self-pay

## 2019-08-10 DIAGNOSIS — Z79899 Other long term (current) drug therapy: Secondary | ICD-10-CM

## 2019-08-10 NOTE — Progress Notes (Signed)
[facility] Visit Note  Patient: Jennifer Wiggins MRN: [id number] Date of Birth: 10/01/1970 PCP: [facility] [place] 48 y.o. female presents for a telephone medication management visit with the pharmacist today.   Patient Information   Past Medical History:  Diagnosis Date  . Uterine fibroid       Past Surgical History:  Procedure Laterality Date  . CESAREAN SECTION    . TUBAL LIGATION      No family history on file.  New Diagnoses (since last visit): Sciatica, OAB  Family Support: Comments:did not ask     Social History   Substance and Sexual Activity  Alcohol Use Never      Social History   Tobacco Use  Smoking Status Current Every Day Smoker  . Packs/day: 0.25  . Types: Cigarettes  Smokeless Tobacco Never Used      Health Maintenance  Topic Date Due  . HIV Screening  11/22/1984  . PAP SMEAR-Modifier  11/23/1990  . INFLUENZA VACCINE  03/13/2019  . TETANUS/TDAP  02/02/2028   Outpatient Encounter Medications as of 08/10/2019  Medication Sig  . amoxicillin (AMOXIL) 500 MG tablet Take 1 tablet (500 mg total) by mouth 3 (three) times daily.  Marland Kitchen buPROPion (WELLBUTRIN SR) 150 MG 12 hr tablet Take 150 mg by mouth 2 (two) times daily.  . cetirizine (ZYRTEC) 10 MG tablet Take 10 mg by mouth daily.  . diazepam (VALIUM) 5 MG tablet Take 5 mg by mouth 2 (two) times daily.  . ferrous sulfate 325 (65 FE) MG tablet Take 325 mg by mouth daily.  . fluticasone (FLONASE) 50 MCG/ACT nasal spray Place 2 sprays into both nostrils daily.  . meloxicam (MOBIC) 15 MG tablet Take 15 mg by mouth daily.  . naproxen (NAPROSYN) 500 MG tablet Take 1 tablet by mouth 2 (two) times daily with a meal.  . ranitidine (ZANTAC) 75 MG tablet Take 75 mg by mouth 2 (two) times daily.  . sertraline (ZOLOFT) 25 MG tablet Take 25 mg by mouth daily.   No facility-administered encounter medications on file as of 08/10/2019.    Health Maintenance/Date  Completed  Last ED visit: 08/02/2019 (otalgia- d/c on amoxicillin) Last Visit to PCP: Unknown Next Visit to PCP: Unknown Specialist visit: [facility] (OAB) Dental Exam: Unknown Eye Exam: Unknown Pelvic/PAP Exam: 06/03/2019 Mammogram: Unknown Colonoscopy: Unknown Flu Vaccine: Unknown Pneumonia Vaccine: Unknown  Assessment and Plan:  Patient read out names of medications from bottles that she appears to keep in a bag. Un-clear which of these medications she is actively taking versus took in the past.  1). Sciatica: Patient has been seen in the ED at [facility] and [facility] multiple times this year for leg pain secondary to sciatica. She has received various medications at these visits including NSAIDs (naproxen, meloxicam, ibuprofen), diazepam, cyclobenzaprine, opioids. She reports that she needs medications for sciatica filled as soon as possible as she is experiencing leg pain.  2). Depression: She is prescribed sertraline 25 mg daily. She also reports having a bottle of bupropion 150 mg written for twice daily. Un-able to find any record of this medication in the chart so un-able to confirm prescriber or indication.  3). Overactive bladder: Patient recently seen by [facility] for evaluation of urge incontinence. Does not appear to have started on any medications for this problem at this time.   4). Seasonal allergies: Patient reports she uses cetirizine and Flonase.   5). History of uterine fibroids: Patient  reports has iron pills which from chart appear to have been prescribed for blood loss secondary to fibroids.  6). Adverse Effects: Denies adverse effects of any medications.  7). Adherence: Reports she needs refills on all of her medications. Un-clear what she is completely out of versus running low on. Many of these medications are likely one-time fill prescriptions written by ED providers.   [place] Resident 10 August 2019

## 2019-09-06 ENCOUNTER — Other Ambulatory Visit: Payer: Self-pay

## 2019-09-06 ENCOUNTER — Ambulatory Visit
Admission: EM | Admit: 2019-09-06 | Discharge: 2019-09-06 | Disposition: A | Discharge status: Home / Self Care | Payer: Self-pay | Attending: Emergency Medicine | Admitting: Emergency Medicine

## 2019-09-06 DIAGNOSIS — H6692 Otitis media, unspecified, left ear: Secondary | ICD-10-CM

## 2019-09-06 MED ORDER — IBUPROFEN 800 MG PO TABS: 800.0000 mg | ORAL_TABLET | Freq: Once | ORAL | Status: DC

## 2019-09-06 MED ORDER — IBUPROFEN 800 MG PO TABS: 800.0000 mg | ORAL_TABLET | Freq: Three times a day (TID) | ORAL | 0 refills | Status: DC | PRN

## 2019-09-06 MED ORDER — DOXYCYCLINE HYCLATE 100 MG PO CAPS: 100.0000 mg | ORAL_CAPSULE | Freq: Two times a day (BID) | ORAL | 0 refills | Status: DC

## 2019-09-06 NOTE — ED Provider Notes (Signed)
[name]    CSN: [id number] Arrival date & time: 09/06/19  1232      History   Chief Complaint Chief Complaint  Patient presents with  . Otalgia    left    HPI Jennifer Wiggins is a 49 y.o. female.   Patient presents with left ear pain x3 days.  She was seen here for similar symptoms on 08/02/2019 and treated with amoxicillin for left otitis media.  She states her symptoms improved but then returned this week.  She also reports sinus drainage and headache.  She denies fever, chills, vomiting, diarrhea, rash, or other symptoms.  No treatments attempted at home.  The history is provided by the patient.    Past Medical History:  Diagnosis Date  . Uterine fibroid     There are no problems to display for this patient.   Past Surgical History:  Procedure Laterality Date  . CESAREAN SECTION    . TUBAL LIGATION      OB History    Gravida  5   Para      Term      Preterm      AB  1   Living  3     SAB  1   TAB      Ectopic      Multiple      Live Births               Home Medications    Prior to Admission medications   Medication Sig Start Date End Date Taking? Authorizing Provider  buPROPion (WELLBUTRIN SR) 150 MG 12 hr tablet Take 150 mg by mouth 2 (two) times daily.   Yes [provider]  cetirizine (ZYRTEC) 10 MG tablet Take 10 mg by mouth daily.   Yes [provider]  diazepam (VALIUM) 5 MG tablet Take 5 mg by mouth 2 (two) times daily. 07/06/19  Yes [provider]  ferrous sulfate 325 (65 FE) MG tablet Take 325 mg by mouth daily.   Yes [provider]  fluticasone (FLONASE) 50 MCG/ACT nasal spray Place 2 sprays into both nostrils daily.   Yes [provider]  meloxicam (MOBIC) 15 MG tablet Take 15 mg by mouth daily.   Yes [provider]  naproxen (NAPROSYN) 500 MG tablet Take 1 tablet by mouth 2 (two) times daily with a meal. 06/23/19 06/22/20 Yes [provider]    ranitidine (ZANTAC) 75 MG tablet Take 75 mg by mouth 2 (two) times daily.   Yes [provider]  sertraline (ZOLOFT) 25 MG tablet Take 25 mg by mouth daily.   Yes [provider]  amoxicillin (AMOXIL) 500 MG tablet Take 1 tablet (500 mg total) by mouth 3 (three) times daily. 08/02/19   Sharion Balloon, NP  doxycycline (VIBRAMYCIN) 100 MG capsule Take 1 capsule (100 mg total) by mouth 2 (two) times daily. 09/06/19   Sharion Balloon, NP  ibuprofen (ADVIL) 800 MG tablet Take 1 tablet (800 mg total) by mouth every 8 (eight) hours as needed. 09/06/19   Sharion Balloon, NP    Family History History reviewed. No pertinent family history.  Social History Social History   Tobacco Use  . Smoking status: Current Every Day Smoker    Packs/day: 0.25    Types: Cigarettes  . Smokeless tobacco: Never Used  Substance Use Topics  . Alcohol use: Never  . Drug use: Never     Allergies   Latex  Review of Systems Review of Systems  Constitutional: Negative for chills and fever.  HENT: Positive for ear pain and postnasal drip. Negative for sore throat.   Eyes: Negative for pain and visual disturbance.  Respiratory: Negative for cough and shortness of breath.   Cardiovascular: Negative for chest pain and palpitations.  Gastrointestinal: Negative for abdominal pain, diarrhea, nausea and vomiting.  Genitourinary: Negative for dysuria and hematuria.  Musculoskeletal: Negative for arthralgias and back pain.  Skin: Negative for color change and rash.  Neurological: Positive for headaches. Negative for dizziness, seizures, syncope, weakness and numbness.  All other systems reviewed and are negative.    Physical Exam Triage Vital Signs ED Triage Vitals  Enc Vitals Group     BP      Pulse      Resp      Temp      Temp src      SpO2      Weight      Height      Head Circumference      Peak Flow      Pain Score      Pain Loc      Pain Edu?      Excl. in [place]?    No data  found.  Updated Vital Signs BP 114/77 (BP Location: Left Arm)   Pulse 70   Temp 98.5 F (36.9 C) (Oral)   Resp 16   Ht 5\' 4"  (1.626 m)   Wt 180 lb (81.6 kg)   LMP 05/13/2019   SpO2 98%   BMI 30.90 kg/m   Visual Acuity Right Eye Distance:   Left Eye Distance:   Bilateral Distance:    Right Eye Near:   Left Eye Near:    Bilateral Near:     Physical Exam Vitals and nursing note reviewed.  Constitutional:      General: She is not in acute distress.    Appearance: She is well-developed. She is not ill-appearing.  HENT:     Head: Normocephalic and atraumatic.     Right Ear: Tympanic membrane normal.     Left Ear: Tympanic membrane is erythematous.     Nose: Nose normal.     Mouth/Throat:     Mouth: Mucous membranes are moist.     Pharynx: Oropharynx is clear.  Eyes:     Conjunctiva/sclera: Conjunctivae normal.  Cardiovascular:     Rate and Rhythm: Normal rate and regular rhythm.     Heart sounds: No murmur.  Pulmonary:     Effort: Pulmonary effort is normal. No respiratory distress.     Breath sounds: Normal breath sounds.  Abdominal:     General: Bowel sounds are normal.     Palpations: Abdomen is soft.     Tenderness: There is no abdominal tenderness. There is no guarding or rebound.  Musculoskeletal:     Cervical back: Neck supple.  Skin:    General: Skin is warm and dry.     Findings: No rash.  Neurological:     General: No focal deficit present.     Mental Status: She is alert and oriented to person, place, and time.      UC Treatments / Results  Labs (all labs ordered are listed, but only abnormal results are displayed) Labs Reviewed - No data to display  EKG   Radiology No results found.  Procedures Procedures (including critical care time)  Medications Ordered in UC Medications - No data to display  Initial  Impression / Assessment and Plan / UC Course  I have reviewed the triage vital signs and the nursing notes.  Pertinent labs &  imaging results that were available during my care of the patient were reviewed by me and considered in my medical decision making (see chart for details).    Left otitis media.  Treating with doxycycline and ibuprofen.  Instructed patient to follow-up with her PCP or ENT if her symptoms are not improving.  Patient agrees to plan of care.     Final Clinical Impressions(s) / UC Diagnoses   Final diagnoses:  Left otitis media, unspecified otitis media type     Discharge Instructions     Take the doxycycline and ibuprofen as directed.    Follow-up with your primary care provider or the suggested ENT if your symptoms are not improving.    ED Prescriptions    Medication Sig Dispense Auth. Provider   ibuprofen (ADVIL) 800 MG tablet Take 1 tablet (800 mg total) by mouth every 8 (eight) hours as needed. 21 tablet Sharion Balloon, NP   doxycycline (VIBRAMYCIN) 100 MG capsule Take 1 capsule (100 mg total) by mouth 2 (two) times daily. 20 capsule Sharion Balloon, NP     PDMP not reviewed this encounter.   Sharion Balloon, NP 09/06/19 [id number]

## 2019-09-06 NOTE — Discharge Instructions (Addendum)
Take the doxycycline and ibuprofen as directed.    Follow-up with your primary care provider or the suggested ENT if your symptoms are not improving.

## 2019-09-06 NOTE — ED Triage Notes (Signed)
Patient complains of ear pain that started a few days ago. Reports that she was seen for this about 1 month ago and states that she feels like she is having sinus drainage and headache.

## 2019-12-08 ENCOUNTER — Other Ambulatory Visit: Payer: Self-pay

## 2019-12-08 ENCOUNTER — Ambulatory Visit: Payer: Self-pay | Admitting: Pharmacy Technician

## 2019-12-08 DIAGNOSIS — Z79899 Other long term (current) drug therapy: Secondary | ICD-10-CM

## 2019-12-08 NOTE — Progress Notes (Signed)
Completed [facility] application for re-certification.  Explained that we still needed unemployment verification letter and 2020 Federal Tax Return.  Patient inquired about obtaining refills.  Explained that we could not provide medication assistance until we received requested financial information.  Patient asked for medications to be transferred to [facility] on [address].     Provided pharmacy team with request from patient.  Pharmacy team to transfer patient's prescriptions to [facility] on [address].    [place] [facility]

## 2019-12-09 ENCOUNTER — Other Ambulatory Visit: Payer: Self-pay

## 2019-12-09 ENCOUNTER — Ambulatory Visit
Admission: EM | Admit: 2019-12-09 | Discharge: 2019-12-09 | Disposition: A | Discharge status: Home / Self Care | Payer: Self-pay | Attending: Emergency Medicine | Admitting: Emergency Medicine

## 2019-12-09 ENCOUNTER — Encounter: Payer: Self-pay | Admitting: Emergency Medicine

## 2019-12-09 DIAGNOSIS — M5431 Sciatica, right side: Secondary | ICD-10-CM

## 2019-12-09 HISTORY — DX: Anxiety disorder, unspecified: F41.9

## 2019-12-09 HISTORY — DX: Depression, unspecified: F32.A

## 2019-12-09 MED ORDER — CYCLOBENZAPRINE HCL 10 MG PO TABS: 10.0000 mg | ORAL_TABLET | Freq: Two times a day (BID) | ORAL | 0 refills | Status: DC | PRN

## 2019-12-09 NOTE — ED Triage Notes (Addendum)
Patient in today c/o right hip pain radiating down right leg x 1 week. Patient has been taking Naproxen 500mg  and  Ibuprofen 800 mg. No injury noted. Patient has an appointment with orthopedics May 12.

## 2019-12-09 NOTE — Discharge Instructions (Signed)
Take the muscle relaxer Flexeril as needed for muscle spasm; do not drive, operate machinery, or drink alcohol with this medication as it may make you drowsy.    Follow up with your orthopedist as scheduled.

## 2019-12-09 NOTE — ED Provider Notes (Signed)
Jennifer Wiggins    CSN: [id number] Arrival date & time: 12/09/19  1322      History   Chief Complaint Chief Complaint  Patient presents with  . Hip Pain    HPI Jennifer Wiggins is a 49 y.o. female.   Patient presents with 1 week history of pain in her right buttock which radiates down her posterior leg to her ankle.  She has been treating her symptoms at home with naproxen, ibuprofen, meloxicam.  She denies back pain, numbness, weakness, bruising, redness, rash, lesions, or other symptoms.  No falls or injury.  She reports the pain is 9/10, sharp, constant, worse with movement and walking, improves with medication. She was seen for similar symptoms on 06/27/2019 at [facility] ED.  The history is provided by the patient.    Past Medical History:  Diagnosis Date  . Anxiety   . Depression   . Uterine fibroid     There are no problems to display for this patient.   Past Surgical History:  Procedure Laterality Date  . CESAREAN SECTION    . TUBAL LIGATION      OB History    Gravida  5   Para      Term      Preterm      AB  1   Living  3     SAB  1   TAB      Ectopic      Multiple      Live Births               Home Medications    Prior to Admission medications   Medication Sig Start Date End Date Taking? Authorizing Provider  buPROPion (WELLBUTRIN SR) 150 MG 12 hr tablet Take 150 mg by mouth 2 (two) times daily.   Yes [provider]  cetirizine (ZYRTEC) 10 MG tablet Take 10 mg by mouth daily.   Yes [provider]  diazepam (VALIUM) 5 MG tablet Take 5 mg by mouth 2 (two) times daily. 07/06/19  Yes [provider]  fluticasone (FLONASE) 50 MCG/ACT nasal spray Place 2 sprays into both nostrils daily.   Yes [provider]  ibuprofen (ADVIL) 800 MG tablet Take 1 tablet (800 mg total) by mouth every 8 (eight) hours as needed. 09/06/19  Yes Sharion Balloon, NP  meloxicam (MOBIC) 15 MG tablet Take 15 mg by mouth daily.    Yes [provider]  naproxen (NAPROSYN) 500 MG tablet Take 1 tablet by mouth 2 (two) times daily with a meal. 06/23/19 06/22/20 Yes [provider]  ranitidine (ZANTAC) 75 MG tablet Take 75 mg by mouth 2 (two) times daily.   Yes [provider]  sertraline (ZOLOFT) 25 MG tablet Take 25 mg by mouth daily.   Yes [provider]  amoxicillin (AMOXIL) 500 MG tablet Take 1 tablet (500 mg total) by mouth 3 (three) times daily. 08/02/19   Sharion Balloon, NP  cyclobenzaprine (FLEXERIL) 10 MG tablet Take 1 tablet (10 mg total) by mouth 2 (two) times daily as needed for muscle spasms. 12/09/19   Sharion Balloon, NP  doxycycline (VIBRAMYCIN) 100 MG capsule Take 1 capsule (100 mg total) by mouth 2 (two) times daily. 09/06/19   Sharion Balloon, NP  ferrous sulfate 325 (65 FE) MG tablet Take 325 mg by mouth daily.    [provider]    Family History Family History  Problem Relation Age of Onset  .  Healthy Mother   . Other Father        MVA    Social History Social History   Tobacco Use  . Smoking status: Current Every Day Smoker    Packs/day: 0.25    Years: 16.00    Pack years: 4.00    Types: Cigarettes  . Smokeless tobacco: Never Used  Substance Use Topics  . Alcohol use: Never  . Drug use: Never     Allergies   Latex   Review of Systems Review of Systems  Constitutional: Negative for chills and fever.  HENT: Negative for ear pain and sore throat.   Eyes: Negative for pain and visual disturbance.  Respiratory: Negative for cough and shortness of breath.   Cardiovascular: Negative for chest pain and palpitations.  Gastrointestinal: Negative for abdominal pain and vomiting.  Genitourinary: Negative for dysuria and hematuria.  Musculoskeletal: Positive for myalgias. Negative for arthralgias and back pain.  Skin: Negative for color change and rash.  Neurological: Negative for seizures, syncope, weakness and numbness.  All other systems reviewed  and are negative.    Physical Exam Triage Vital Signs ED Triage Vitals  Enc Vitals Group     BP      Pulse      Resp      Temp      Temp src      SpO2      Weight      Height      Head Circumference      Peak Flow      Pain Score      Pain Loc      Pain Edu?      Excl. in [place]?    No data found.  Updated Vital Signs BP 131/77 (BP Location: Left Arm)   Pulse 99   Temp 98.5 F (36.9 C) (Oral)   Resp 18   Ht 5\' 5"  (1.651 m)   Wt 195 lb (88.5 kg)   LMP 05/13/2019   BMI 32.45 kg/m   Visual Acuity Right Eye Distance:   Left Eye Distance:   Bilateral Distance:    Right Eye Near:   Left Eye Near:    Bilateral Near:     Physical Exam Vitals and nursing note reviewed.  Constitutional:      General: She is not in acute distress.    Appearance: She is well-developed.  HENT:     Head: Normocephalic and atraumatic.     Mouth/Throat:     Mouth: Mucous membranes are moist.  Eyes:     Conjunctiva/sclera: Conjunctivae normal.  Cardiovascular:     Rate and Rhythm: Normal rate and regular rhythm.     Heart sounds: No murmur.  Pulmonary:     Effort: Pulmonary effort is normal. No respiratory distress.     Breath sounds: Normal breath sounds.  Abdominal:     Palpations: Abdomen is soft.     Tenderness: There is no abdominal tenderness. There is no guarding or rebound.  Musculoskeletal:        General: No swelling or deformity. Normal range of motion.     Cervical back: Neck supple.  Skin:    General: Skin is warm and dry.     Findings: No bruising, erythema, lesion or rash.  Neurological:     General: No focal deficit present.     Mental Status: She is alert and oriented to person, place, and time.     Sensory: No sensory deficit.  Motor: No weakness.     Gait: Gait abnormal.     Comments: Slow limping gait.      UC Treatments / Results  Labs (all labs ordered are listed, but only abnormal results are displayed) Labs Reviewed - No data to  display  EKG   Radiology No results found.  Procedures Procedures (including critical care time)  Medications Ordered in UC Medications - No data to display  Initial Impression / Assessment and Plan / UC Course  I have reviewed the triage vital signs and the nursing notes.  Pertinent labs & imaging results that were available during my care of the patient were reviewed by me and considered in my medical decision making (see chart for details).   Sciatica of right side.  Treating with Flexeril. Precautions for drowsiness with Flexeril discussed with patient.  Counseled her not to take multiple NSAIDs together.  Instructed her to follow up with Ortho as scheduled on 12/22/2019.  Patient agrees to plan of care.      Final Clinical Impressions(s) / UC Diagnoses   Final diagnoses:  Sciatica of right side     Discharge Instructions     Take the muscle relaxer Flexeril as needed for muscle spasm; do not drive, operate machinery, or drink alcohol with this medication as it may make you drowsy.    Follow up with your orthopedist as scheduled.         ED Prescriptions    Medication Sig Dispense Auth. Provider   cyclobenzaprine (FLEXERIL) 10 MG tablet Take 1 tablet (10 mg total) by mouth 2 (two) times daily as needed for muscle spasms. 20 tablet Sharion Balloon, NP     I have reviewed the PDMP during this encounter.   Sharion Balloon, NP 12/09/19 1410

## 2019-12-10 ENCOUNTER — Telehealth: Payer: Self-pay | Admitting: Pharmacy Technician

## 2019-12-10 NOTE — Telephone Encounter (Signed)
Patient e-mailed unemployment verification letter and 2020 tax return.  Unemployment letter is fine but 2020 tax return is not legible.  Patient asked to bring tax return to clinic.  Unemployment benefits end in September 2021.  Made patient aware that [facility] will need updated poi by May 12, 2020 in order to continue to receive medication assistance after that point.    Patient had stated on 12/08/19 that she did not want to provide updated poi.  Asked that her prescriptions be sent to [facility] on [address].  Informed patient that prescriptions were transferred.  Asked patient to contact [facility] and have prescriptions sent back to [facility].  [place] [facility]

## 2019-12-14 ENCOUNTER — Ambulatory Visit
Admission: EM | Admit: 2019-12-14 | Discharge: 2019-12-14 | Disposition: A | Discharge status: Home / Self Care | Payer: Self-pay | Attending: Emergency Medicine | Admitting: Emergency Medicine

## 2019-12-14 ENCOUNTER — Other Ambulatory Visit: Payer: Self-pay

## 2019-12-14 DIAGNOSIS — M5431 Sciatica, right side: Secondary | ICD-10-CM

## 2019-12-14 MED ORDER — METHYLPREDNISOLONE SODIUM SUCC 125 MG IJ SOLR
125.0000 mg | Freq: Once | INTRAMUSCULAR | Status: AC
  Administered 2019-12-14: 16:00:00 125 mg via INTRAMUSCULAR

## 2019-12-14 NOTE — Progress Notes (Addendum)
Pt became very upset, rude and disrespectful during triage. During the pain assessment she stated "There is no need to be asking me all these psychotic questions". She refused to answer any more triage questions and told me to leave the room and go get the provider. I advised her the triage process must be completed before she can see the provider. She still insisted I leave the room. I then left the room without completing triage past the pain assessment. The provider has been advised. Another CMA has gone into the room to attempt to complete the triage.

## 2019-12-14 NOTE — ED Provider Notes (Signed)
Jennifer Wiggins    CSN: [id number] Arrival date & time: 12/14/19  1503      History   Chief Complaint Chief Complaint  Patient presents with  . Leg Pain    right, x1 week    HPI Jennifer Wiggins is a 49 y.o. female.   Patient presents with ongoing pain in her right buttock radiating down her right posterior leg x 2 weeks.  She describes the pain as constant, 10/10, aching, and sharp.  She denies back pain.  No falls or injury.  She has been taking ibuprofen, Flexeril, meloxicam without relief.  No LE weakness or numbness.  No rash, lesions, bruising, redness, or other symptoms.    The history is provided by the patient.    Past Medical History:  Diagnosis Date  . Anxiety   . Depression   . Uterine fibroid     There are no problems to display for this patient.   Past Surgical History:  Procedure Laterality Date  . CESAREAN SECTION    . TUBAL LIGATION      OB History    Gravida  5   Para      Term      Preterm      AB  1   Living  3     SAB  1   TAB      Ectopic      Multiple      Live Births               Home Medications    Prior to Admission medications   Medication Sig Start Date End Date Taking? Authorizing Provider  amoxicillin (AMOXIL) 500 MG tablet Take 1 tablet (500 mg total) by mouth 3 (three) times daily. 08/02/19   Sharion Balloon, NP  buPROPion [facility] SR) 150 MG 12 hr tablet Take 150 mg by mouth 2 (two) times daily.    [provider]  cetirizine (ZYRTEC) 10 MG tablet Take 10 mg by mouth daily.    [provider]  cyclobenzaprine (FLEXERIL) 10 MG tablet Take 1 tablet (10 mg total) by mouth 2 (two) times daily as needed for muscle spasms. 12/09/19   Sharion Balloon, NP  diazepam (VALIUM) 5 MG tablet Take 5 mg by mouth 2 (two) times daily. 07/06/19   [provider]  doxycycline (VIBRAMYCIN) 100 MG capsule Take 1 capsule (100 mg total) by mouth 2 (two) times daily. 09/06/19   Sharion Balloon, NP  ferrous  sulfate 325 (65 FE) MG tablet Take 325 mg by mouth daily.    [provider]  fluticasone (FLONASE) 50 MCG/ACT nasal spray Place 2 sprays into both nostrils daily.    [provider]  ibuprofen (ADVIL) 800 MG tablet Take 1 tablet (800 mg total) by mouth every 8 (eight) hours as needed. 09/06/19   Sharion Balloon, NP  meloxicam (MOBIC) 15 MG tablet Take 15 mg by mouth daily.    [provider]  naproxen (NAPROSYN) 500 MG tablet Take 1 tablet by mouth 2 (two) times daily with a meal. 06/23/19 06/22/20  [provider]  ranitidine (ZANTAC) 75 MG tablet Take 75 mg by mouth 2 (two) times daily.    [provider]  sertraline (ZOLOFT) 25 MG tablet Take 25 mg by mouth daily.    [provider]    Family History Family History  Problem Relation Age of Onset  . Healthy Mother   . Other Father  MVA    Social History Social History   Tobacco Use  . Smoking status: Current Every Day Smoker    Packs/day: 0.25    Years: 16.00    Pack years: 4.00    Types: Cigarettes  . Smokeless tobacco: Never Used  Substance Use Topics  . Alcohol use: Never  . Drug use: Never     Allergies   Latex   Review of Systems Review of Systems  Constitutional: Negative for chills and fever.  HENT: Negative for ear pain and sore throat.   Eyes: Negative for pain and visual disturbance.  Respiratory: Negative for cough and shortness of breath.   Cardiovascular: Negative for chest pain and palpitations.  Gastrointestinal: Negative for abdominal pain and vomiting.  Genitourinary: Negative for dysuria and hematuria.  Musculoskeletal: Positive for myalgias. Negative for arthralgias and back pain.  Skin: Negative for color change and rash.  Neurological: Negative for seizures, syncope, weakness and numbness.  All other systems reviewed and are negative.    Physical Exam Triage Vital Signs ED Triage Vitals  Enc Vitals Group     BP      Pulse       Resp      Temp      Temp src      SpO2      Weight      Height      Head Circumference      Peak Flow      Pain Score      Pain Loc      Pain Edu?      Excl. in [place]?    No data found.  Updated Vital Signs BP 125/88 (BP Location: Right Arm)   Pulse 60   Temp 98.2 F (36.8 C) (Oral)   Resp 18   LMP 05/13/2019   SpO2 97%   Visual Acuity Right Eye Distance:   Left Eye Distance:   Bilateral Distance:    Right Eye Near:   Left Eye Near:    Bilateral Near:     Physical Exam Vitals and nursing note reviewed.  Constitutional:      General: She is not in acute distress.    Appearance: She is well-developed.  HENT:     Head: Normocephalic and atraumatic.     Mouth/Throat:     Mouth: Mucous membranes are moist.  Eyes:     Conjunctiva/sclera: Conjunctivae normal.  Cardiovascular:     Rate and Rhythm: Normal rate and regular rhythm.     Heart sounds: No murmur.  Pulmonary:     Effort: Pulmonary effort is normal. No respiratory distress.     Breath sounds: Normal breath sounds.  Abdominal:     Palpations: Abdomen is soft.     Tenderness: There is no abdominal tenderness.  Musculoskeletal:        General: Normal range of motion.     Cervical back: Neck supple.  Skin:    General: Skin is warm and dry.  Neurological:     General: No focal deficit present.     Mental Status: She is alert and oriented to person, place, and time.     Sensory: No sensory deficit.     Motor: No weakness.     Gait: Gait abnormal.     Comments: Limping slow gait.    Psychiatric:     Comments: Patient is argumentative, cursing at staff, voice raised.  States she needs pain medication.  UC Treatments / Results  Labs (all labs ordered are listed, but only abnormal results are displayed) Labs Reviewed - No data to display  EKG   Radiology No results found.  Procedures Procedures (including critical care time)  Medications Ordered in UC Medications  methylPREDNISolone  sodium succinate (SOLU-MEDROL) 125 mg/2 mL injection 125 mg (125 mg Intramuscular Given 12/14/19 1556)    Initial Impression / Assessment and Plan / UC Course  I have reviewed the triage vital signs and the nursing notes.  Pertinent labs & imaging results that were available during my care of the patient were reviewed by me and considered in my medical decision making (see chart for details).   Right-sided sciatica.  Treated with injection of Solu-Medrol and instructed patient to follow-up with orthopedics.  Discussed walk-in clinic available at [facility].   Final Clinical Impressions(s) / UC Diagnoses   Final diagnoses:  Sciatica of right side     Discharge Instructions     Your were given an injection of Solu-Medrol for your Sciatica.      Follow up with Orthopedics if your symptoms persist.        ED Prescriptions    None     PDMP not reviewed this encounter.   Sharion Balloon, NP 12/14/19 1624

## 2019-12-14 NOTE — Discharge Instructions (Addendum)
Your were given an injection of Solu-Medrol for your Sciatica.      Follow up with Orthopedics if your symptoms persist.

## 2019-12-14 NOTE — ED Triage Notes (Addendum)
Pt presents with c/o nerve pain in her right leg, radiating from her hip all the way down her leg. Pt states pain has been present for one week. Pt states she has been taking Ibuprofen, Meloxicam and Cyclobenzaprine with no relief. Pt denies any known injuries to her back or leg.

## 2020-01-12 ENCOUNTER — Emergency Department
Admission: EM | Admit: 2020-01-12 | Discharge: 2020-01-12 | Disposition: A | Discharge status: Home / Self Care | Payer: No Typology Code available for payment source | Attending: Student in an Organized Health Care Education/Training Program | Admitting: Student in an Organized Health Care Education/Training Program

## 2020-01-12 ENCOUNTER — Other Ambulatory Visit: Payer: Self-pay

## 2020-01-12 ENCOUNTER — Emergency Department: Payer: No Typology Code available for payment source

## 2020-01-12 DIAGNOSIS — S99911A Unspecified injury of right ankle, initial encounter: Secondary | ICD-10-CM | POA: Diagnosis present

## 2020-01-12 DIAGNOSIS — Z79899 Other long term (current) drug therapy: Secondary | ICD-10-CM | POA: Diagnosis not present

## 2020-01-12 DIAGNOSIS — Y999 Unspecified external cause status: Secondary | ICD-10-CM | POA: Insufficient documentation

## 2020-01-12 DIAGNOSIS — S93401A Sprain of unspecified ligament of right ankle, initial encounter: Secondary | ICD-10-CM

## 2020-01-12 DIAGNOSIS — F17210 Nicotine dependence, cigarettes, uncomplicated: Secondary | ICD-10-CM | POA: Diagnosis not present

## 2020-01-12 DIAGNOSIS — Y9389 Activity, other specified: Secondary | ICD-10-CM | POA: Insufficient documentation

## 2020-01-12 DIAGNOSIS — Y92410 Unspecified street and highway as the place of occurrence of the external cause: Secondary | ICD-10-CM | POA: Insufficient documentation

## 2020-01-12 DIAGNOSIS — Z91040 Latex allergy status: Secondary | ICD-10-CM | POA: Insufficient documentation

## 2020-01-12 MED ORDER — TRAMADOL HCL 50 MG PO TABS: 50.0000 mg | ORAL_TABLET | Freq: Four times a day (QID) | ORAL | 0 refills | Status: DC | PRN

## 2020-01-12 MED ORDER — CYCLOBENZAPRINE HCL 10 MG PO TABS: 10.0000 mg | ORAL_TABLET | Freq: Three times a day (TID) | ORAL | 0 refills | Status: DC | PRN

## 2020-01-12 MED ORDER — DEXAMETHASONE SODIUM PHOSPHATE 10 MG/ML IJ SOLN
10.0000 mg | Freq: Once | INTRAMUSCULAR | Status: AC
  Administered 2020-01-12: 10 mg via INTRAMUSCULAR
  Filled 2020-01-12: qty 1

## 2020-01-12 MED ORDER — IBUPROFEN 600 MG PO TABS: 600.0000 mg | ORAL_TABLET | Freq: Three times a day (TID) | ORAL | 0 refills | Status: DC | PRN

## 2020-01-12 MED ORDER — ORPHENADRINE CITRATE 30 MG/ML IJ SOLN
60.0000 mg | Freq: Two times a day (BID) | INTRAMUSCULAR | Status: DC
  Administered 2020-01-12: 60 mg via INTRAMUSCULAR
  Filled 2020-01-12: qty 2
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
## 2020-01-12 NOTE — Discharge Instructions (Signed)
Wear ankle support and ambulate with crutches 3 to 5 days as needed.
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
## 2020-01-27 ENCOUNTER — Other Ambulatory Visit: Payer: Self-pay | Admitting: Physician Assistant

## 2020-01-28 ENCOUNTER — Telehealth: Payer: Self-pay | Admitting: Pharmacist

## 2020-01-28 ENCOUNTER — Emergency Department
Admission: EM | Admit: 2020-01-28 | Discharge: 2020-01-28 | Disposition: A | Discharge status: Home / Self Care | Payer: Self-pay | Attending: Emergency Medicine | Admitting: Emergency Medicine

## 2020-01-28 ENCOUNTER — Emergency Department: Payer: Self-pay

## 2020-01-28 ENCOUNTER — Encounter: Payer: Self-pay | Admitting: Emergency Medicine

## 2020-01-28 ENCOUNTER — Other Ambulatory Visit: Payer: Self-pay

## 2020-01-28 DIAGNOSIS — Z5321 Procedure and treatment not carried out due to patient leaving prior to being seen by health care provider: Secondary | ICD-10-CM | POA: Insufficient documentation

## 2020-01-28 DIAGNOSIS — F419 Anxiety disorder, unspecified: Secondary | ICD-10-CM | POA: Insufficient documentation
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
## 2020-01-28 NOTE — ED Notes (Signed)
This RN back to triage room to discuss with patient verbal orders from EDP, pt refusing to speak to this RN until she receives a warm blanket, this RN explained would grab patient a warm blanket on way to lobby, pt then states, "well what did the doctor say?" This RN explained per patient request would explain after patient received a blanket. Pt then became irate and started yelling at this RN, this RN explained zero tolerance policy for verbal abuse towards staff to patient. Pt continues to be irate on way back to lobby, yelling at staff and while in lobby. First RN aware.
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
## 2020-02-02 ENCOUNTER — Encounter: Payer: Self-pay | Admitting: Emergency Medicine

## 2020-02-02 ENCOUNTER — Emergency Department: Payer: No Typology Code available for payment source

## 2020-02-02 ENCOUNTER — Emergency Department
Admission: EM | Admit: 2020-02-02 | Discharge: 2020-02-02 | Disposition: A | Discharge status: Home / Self Care | Payer: No Typology Code available for payment source | Attending: Emergency Medicine | Admitting: Emergency Medicine

## 2020-02-02 ENCOUNTER — Other Ambulatory Visit: Payer: Self-pay

## 2020-02-02 DIAGNOSIS — G8929 Other chronic pain: Secondary | ICD-10-CM | POA: Insufficient documentation

## 2020-02-02 DIAGNOSIS — M545 Low back pain: Secondary | ICD-10-CM | POA: Diagnosis present

## 2020-02-02 DIAGNOSIS — F17210 Nicotine dependence, cigarettes, uncomplicated: Secondary | ICD-10-CM | POA: Insufficient documentation

## 2020-02-02 DIAGNOSIS — Z91040 Latex allergy status: Secondary | ICD-10-CM | POA: Diagnosis not present

## 2020-02-02 DIAGNOSIS — M5441 Lumbago with sciatica, right side: Secondary | ICD-10-CM | POA: Diagnosis not present

## 2020-02-02 MED ORDER — TRAMADOL HCL 50 MG PO TABS: 50.0000 mg | ORAL_TABLET | Freq: Four times a day (QID) | ORAL | 0 refills | Status: DC | PRN

## 2020-02-02 MED ORDER — CYCLOBENZAPRINE HCL 10 MG PO TABS
5.0000 mg | ORAL_TABLET | Freq: Once | ORAL | Status: AC
  Administered 2020-02-02: 5 mg via ORAL
  Filled 2020-02-02: qty 1

## 2020-02-02 MED ORDER — CYCLOBENZAPRINE HCL 5 MG PO TABS: 5.0000 mg | ORAL_TABLET | Freq: Three times a day (TID) | ORAL | 0 refills | Status: DC | PRN

## 2020-02-02 MED ORDER — DIAZEPAM 5 MG PO TABS
5.0000 mg | ORAL_TABLET | Freq: Three times a day (TID) | ORAL | 0 refills | Status: DC | PRN
Start: 2020-02-02 — End: 2020-08-29

## 2020-02-02 MED ORDER — DEXAMETHASONE SODIUM PHOSPHATE 10 MG/ML IJ SOLN: 10.0000 mg | Freq: Once | INTRAMUSCULAR | Status: DC

## 2020-02-02 MED ORDER — DIPHENHYDRAMINE HCL 25 MG PO CAPS: 50.0000 mg | ORAL_CAPSULE | Freq: Once | ORAL | Status: DC

## 2020-02-02 MED ORDER — TRAMADOL HCL 50 MG PO TABS
50.0000 mg | ORAL_TABLET | Freq: Once | ORAL | Status: AC
  Administered 2020-02-02: 50 mg via ORAL
  Filled 2020-02-02: qty 1

## 2020-02-02 MED ORDER — PREDNISONE 10 MG PO TABS
ORAL_TABLET | ORAL | 0 refills | Status: DC
Start: 2020-02-02 — End: 2020-08-29
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
## 2020-02-02 NOTE — Discharge Instructions (Addendum)
Follow up with the doctor that order your MRI and that you have seen in his office.  Contact information  Raymondo Band, MD   [address]   [place], [place] [address]   [phone]    Prescriptions were sent to your pharmacy including steroids which should reduce inflammation of your degenerative changes as well as help with pain in your lower back.  Do not take ibuprofen with this medication however you can take Tylenol while taking the steroids and then return to ibuprofen after you finish the steroids.  Use ice or heat to your back.  Continue taking Flexeril if needed for muscle spasms.  Tramadol as needed for severe pain.  Diazepam was prescribed today as you are almost out however this medication cannot be taken with Flexeril.  If additional "nerve medication" is needed you will need to follow-up with your primary care provider at [facility] or follow-up with [facility] for anxiety or "nerves".
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
## 2020-02-08 ENCOUNTER — Other Ambulatory Visit: Payer: Self-pay

## 2020-02-10 ENCOUNTER — Ambulatory Visit
Admission: EM | Admit: 2020-02-10 | Discharge: 2020-02-10 | Disposition: A | Discharge status: Home / Self Care | Payer: Self-pay

## 2020-02-10 ENCOUNTER — Other Ambulatory Visit: Payer: Self-pay

## 2020-02-10 DIAGNOSIS — R52 Pain, unspecified: Secondary | ICD-10-CM

## 2020-02-10 DIAGNOSIS — Z765 Malingerer [conscious simulation]: Secondary | ICD-10-CM

## 2020-02-10 DIAGNOSIS — G8929 Other chronic pain: Secondary | ICD-10-CM
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
## 2020-02-10 NOTE — Discharge Instructions (Addendum)
Go to the Emergency Department if you have acute pain requiring pain medication.
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
## 2020-03-17 ENCOUNTER — Other Ambulatory Visit: Payer: Self-pay | Admitting: Student

## 2020-03-29 ENCOUNTER — Ambulatory Visit
Admission: EM | Admit: 2020-03-29 | Discharge: 2020-03-29 | Disposition: A | Discharge status: Home / Self Care | Payer: Self-pay | Attending: Emergency Medicine | Admitting: Emergency Medicine

## 2020-03-29 DIAGNOSIS — R490 Dysphonia: Secondary | ICD-10-CM | POA: Insufficient documentation

## 2020-03-29 LAB — POCT RAPID STREP A (OFFICE): Rapid Strep A Screen: NEGATIVE

## 2020-03-29 NOTE — Discharge Instructions (Signed)
Your rapid strep test is negative.  A throat culture is pending; we will call you if it is positive requiring treatment.    See the attached instructions for your hoarseness.    Follow up with your primary care provider if your symptoms are not improving.

## 2020-03-29 NOTE — ED Triage Notes (Signed)
Patient presents with concerns she may be loosing her voice. Patient states that her throat is not sore, but she feels like she is loosing her voice and is worried she may have laryngitis. Denies any other symptoms.

## 2020-03-29 NOTE — ED Provider Notes (Signed)
[name]    CSN: [id number] Arrival date & time: 03/29/20  1022      History   Chief Complaint Chief Complaint  Patient presents with   Loosing Voice    HPI Jennifer Wiggins is a 49 y.o. female.   Patient presents with 1 week history of feeling that her voice is hoarse.  She states her throat is dry.  She also reports a nonproductive occasional cough.  She denies sore throat, fever, chills, shortness of breath, vomiting, diarrhea, rash, or other symptoms.  No treatments attempted at home.  She states she had a COVID test done yesterday and the results are pending.  The history is provided by the patient.    Past Medical History:  Diagnosis Date   Anxiety    Depression    Uterine fibroid     There are no problems to display for this patient.   Past Surgical History:  Procedure Laterality Date   CESAREAN SECTION     TUBAL LIGATION      OB History    Gravida  5   Para      Term      Preterm      AB  1   Living  3     SAB  1   TAB      Ectopic      Multiple      Live Births               Home Medications    Prior to Admission medications   Medication Sig Start Date End Date Taking? Authorizing Provider  buPROPion (WELLBUTRIN SR) 150 MG 12 hr tablet Take 150 mg by mouth 2 (two) times daily.    [provider]  cetirizine (ZYRTEC) 10 MG tablet Take 10 mg by mouth daily.    [provider]  cyclobenzaprine (FLEXERIL) 5 MG tablet Take 1 tablet (5 mg total) by mouth 3 (three) times daily as needed for muscle spasms. 02/02/20   Johnn Hai, PA-C  diazepam (VALIUM) 5 MG tablet Take 1 tablet (5 mg total) by mouth every 8 (eight) hours as needed for anxiety. 02/02/20 02/01/21  Johnn Hai, PA-C  ferrous sulfate 325 (65 FE) MG tablet Take 325 mg by mouth daily.    [provider]  fluticasone (FLONASE) 50 MCG/ACT nasal spray Place 2 sprays into both nostrils daily.    [provider]    gabapentin (NEURONTIN) 300 MG capsule Take by mouth. 01/14/20 04/13/20  [provider]  ibuprofen (ADVIL) 800 MG tablet Take by mouth. 12/06/19   [provider]  Melatonin 1 MG CHEW Chew by mouth. 01/20/20   [provider]  meloxicam (MOBIC) 15 MG tablet Take 15 mg by mouth daily.    [provider]  meloxicam (MOBIC) 7.5 MG tablet Take 7.5 mg by mouth 2 (two) times daily as needed. 12/09/19   [provider]  methylPREDNISolone (MEDROL DOSEPAK) 4 MG TBPK tablet See admin instructions. 01/14/20   [provider]  predniSONE (DELTASONE) 10 MG tablet Take 6 tablets  today, on day 2 take 5 tablets, day 3 take 4 tablets, day 4 take 3 tablets, day 5 take  2 tablets and 1 tablet the last day 02/02/20   Johnn Hai, PA-C  ranitidine (ZANTAC) 75 MG tablet Take 75 mg by mouth 2 (two) times daily.    [provider]  sertraline (ZOLOFT) 25 MG tablet Take 25 mg  by mouth daily.    [provider]  traMADol (ULTRAM) 50 MG tablet Take 1 tablet (50 mg total) by mouth every 6 (six) hours as needed. 02/02/20   Johnn Hai, PA-C    Family History Family History  Problem Relation Age of Onset   Healthy Mother    Other Father        MVA    Social History Social History   Tobacco Use   Smoking status: Current Every Day Smoker    Packs/day: 0.25    Years: 16.00    Pack years: 4.00    Types: Cigarettes   Smokeless tobacco: Never Used  Scientific laboratory technician Use: Never used  Substance Use Topics   Alcohol use: Not Currently   Drug use: Not Currently     Allergies   Latex   Review of Systems Review of Systems  Constitutional: Negative for chills and fever.  HENT: Positive for voice change. Negative for ear pain, sore throat and trouble swallowing.   Eyes: Negative for pain and visual disturbance.  Respiratory: Positive for cough. Negative for shortness of breath.   Cardiovascular: Negative for chest pain and  palpitations.  Gastrointestinal: Negative for abdominal pain, diarrhea and vomiting.  Genitourinary: Negative for dysuria and hematuria.  Musculoskeletal: Negative for arthralgias and back pain.  Skin: Negative for color change and rash.  Neurological: Negative for seizures and syncope.  All other systems reviewed and are negative.    Physical Exam Triage Vital Signs ED Triage Vitals [03/29/20 1023]  Enc Vitals Group     BP      Pulse      Resp      Temp      Temp src      SpO2      Weight      Height      Head Circumference      Peak Flow      Pain Score 0     Pain Loc      Pain Edu?      Excl. in [place]?    No data found.  Updated Vital Signs BP 129/85    Pulse 71    Temp 98.8 F (37.1 C)    Resp 16    LMP 05/13/2019    SpO2 99%   Visual Acuity Right Eye Distance:   Left Eye Distance:   Bilateral Distance:    Right Eye Near:   Left Eye Near:    Bilateral Near:     Physical Exam Vitals and nursing note reviewed.  Constitutional:      General: She is not in acute distress.    Appearance: She is well-developed. She is not ill-appearing.  HENT:     Head: Normocephalic and atraumatic.     Right Ear: Tympanic membrane normal.     Left Ear: Tympanic membrane normal.     Nose: Nose normal.     Mouth/Throat:     Mouth: Mucous membranes are moist.     Pharynx: Oropharynx is clear.     Comments: Voice clear. Eyes:     Conjunctiva/sclera: Conjunctivae normal.  Cardiovascular:     Rate and Rhythm: Normal rate and regular rhythm.     Heart sounds: No murmur heard.   Pulmonary:     Effort: Pulmonary effort is normal. No respiratory distress.     Breath sounds: Normal breath sounds.  Abdominal:     Palpations: Abdomen is soft.  Tenderness: There is no abdominal tenderness. There is no guarding or rebound.  Musculoskeletal:     Cervical back: Neck supple.  Skin:    General: Skin is warm and dry.     Findings: No rash.  Neurological:     General: No focal  deficit present.     Mental Status: She is alert and oriented to person, place, and time.     Gait: Gait normal.  Psychiatric:        Mood and Affect: Mood normal.        Behavior: Behavior normal.      UC Treatments / Results  Labs (all labs ordered are listed, but only abnormal results are displayed) Labs Reviewed  CULTURE, GROUP A STREP [facility])  POCT RAPID STREP A (OFFICE)    EKG   Radiology No results found.  Procedures Procedures (including critical care time)  Medications Ordered in UC Medications - No data to display  Initial Impression / Assessment and Plan / UC Course  I have reviewed the triage vital signs and the nursing notes.  Pertinent labs & imaging results that were available during my care of the patient were reviewed by me and considered in my medical decision making (see chart for details).   Subjective hoarseness.  Rapid strep negative; throat culture pending.  Education provided on hoarseness.  Instructed patient to follow-up with her PCP if her symptoms or not improving.  Patient agrees to plan of care.     Final Clinical Impressions(s) / UC Diagnoses   Final diagnoses:  Hoarseness     Discharge Instructions     Your rapid strep test is negative.  A throat culture is pending; we will call you if it is positive requiring treatment.    See the attached instructions for your hoarseness.    Follow up with your primary care provider if your symptoms are not improving.        ED Prescriptions    None     PDMP not reviewed this encounter.   Sharion Balloon, NP 03/29/20 1106

## 2020-03-31 LAB — CULTURE, GROUP A STREP (THRC)

## 2020-04-13 ENCOUNTER — Other Ambulatory Visit: Payer: Self-pay | Admitting: Neurology

## 2020-05-01 ENCOUNTER — Ambulatory Visit
Admission: EM | Admit: 2020-05-01 | Discharge: 2020-05-01 | Disposition: A | Discharge status: Home / Self Care | Payer: Self-pay | Attending: Emergency Medicine | Admitting: Emergency Medicine

## 2020-05-01 ENCOUNTER — Other Ambulatory Visit: Payer: Self-pay

## 2020-05-01 DIAGNOSIS — R519 Headache, unspecified: Secondary | ICD-10-CM

## 2020-05-01 MED ORDER — IBUPROFEN 600 MG PO TABS
600.0000 mg | ORAL_TABLET | Freq: Four times a day (QID) | ORAL | 0 refills | Status: DC | PRN
Start: 2020-05-01 — End: 2020-08-29

## 2020-05-01 NOTE — Discharge Instructions (Addendum)
Take ibuprofen as needed for your discomfort.    Follow up with your primary care provider if your symptoms are not improving.
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
## 2020-05-04 ENCOUNTER — Other Ambulatory Visit: Payer: Self-pay | Admitting: Student

## 2020-05-07 ENCOUNTER — Other Ambulatory Visit: Payer: Self-pay

## 2020-05-07 ENCOUNTER — Emergency Department: Payer: No Typology Code available for payment source

## 2020-05-07 ENCOUNTER — Encounter: Payer: Self-pay | Admitting: Emergency Medicine

## 2020-05-07 ENCOUNTER — Emergency Department
Admission: EM | Admit: 2020-05-07 | Discharge: 2020-05-07 | Disposition: A | Discharge status: Home / Self Care | Payer: No Typology Code available for payment source | Attending: Emergency Medicine | Admitting: Emergency Medicine

## 2020-05-07 DIAGNOSIS — F17210 Nicotine dependence, cigarettes, uncomplicated: Secondary | ICD-10-CM | POA: Diagnosis not present

## 2020-05-07 DIAGNOSIS — Z91040 Latex allergy status: Secondary | ICD-10-CM | POA: Diagnosis not present

## 2020-05-07 DIAGNOSIS — M5412 Radiculopathy, cervical region: Secondary | ICD-10-CM | POA: Diagnosis not present

## 2020-05-07 DIAGNOSIS — M79601 Pain in right arm: Secondary | ICD-10-CM | POA: Diagnosis present

## 2020-05-07 MED ORDER — TRAMADOL HCL 50 MG PO TABS: 50.0000 mg | ORAL_TABLET | Freq: Four times a day (QID) | ORAL | 0 refills | Status: AC | PRN

## 2020-05-07 MED ORDER — TRAMADOL HCL 50 MG PO TABS
50.0000 mg | ORAL_TABLET | Freq: Once | ORAL | Status: AC
  Administered 2020-05-07: 50 mg via ORAL
  Filled 2020-05-07: qty 1

## 2020-05-07 MED ORDER — LIDOCAINE 5 % EX PTCH
1.0000 | MEDICATED_PATCH | CUTANEOUS | Status: DC
  Administered 2020-05-07: 1 via TRANSDERMAL
  Filled 2020-05-07: qty 1
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
## 2020-05-07 NOTE — Discharge Instructions (Addendum)
Follow discharge care instruction Wear arm sling until evaluation by a orthopedics.

## 2020-05-10 ENCOUNTER — Other Ambulatory Visit: Payer: Self-pay | Admitting: Neurology

## 2020-05-19 ENCOUNTER — Telehealth: Payer: Self-pay | Admitting: Pharmacy Technician

## 2020-05-19 NOTE — Telephone Encounter (Signed)
Patient inquiring about having prescription transferred from [facility] to [facility].  Ellie Lunch contacted [facility] and requested prescription be transferred to [facility].  Spoke with patient and made patient aware that we need updated financial information.  Patient stated that she would bring support person with her on Monday, 10/11 to sign letter of support when she picks-up medication.  [place] [facility]

## 2020-05-22 ENCOUNTER — Other Ambulatory Visit: Payer: Self-pay

## 2020-06-14 ENCOUNTER — Telehealth: Payer: Self-pay | Admitting: Pharmacy Technician

## 2020-06-14 ENCOUNTER — Other Ambulatory Visit: Payer: Self-pay | Admitting: Student

## 2020-06-14 NOTE — Telephone Encounter (Signed)
Received updated proof of income.  Patient eligible to receive medication assistance at [facility] until time for re-certification in 9359, and as long as eligibility requirements continue to be met.  [place] [facility]

## 2020-06-15 ENCOUNTER — Other Ambulatory Visit: Payer: Self-pay

## 2020-06-15 ENCOUNTER — Ambulatory Visit: Payer: Self-pay

## 2020-06-15 DIAGNOSIS — Z79899 Other long term (current) drug therapy: Secondary | ICD-10-CM
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
## 2020-06-23 ENCOUNTER — Other Ambulatory Visit: Payer: Self-pay | Admitting: Physical Medicine and Rehabilitation

## 2020-06-27 ENCOUNTER — Other Ambulatory Visit: Payer: Self-pay | Admitting: Student

## 2020-06-27 DIAGNOSIS — M25559 Pain in unspecified hip: Secondary | ICD-10-CM

## 2020-06-28 ENCOUNTER — Ambulatory Visit
Admission: RE | Admit: 2020-06-28 | Discharge: 2020-06-28 | Disposition: A | Discharge status: Home / Self Care | Payer: No Typology Code available for payment source | Source: Ambulatory Visit

## 2020-06-28 ENCOUNTER — Ambulatory Visit
Admission: RE | Admit: 2020-06-28 | Discharge: 2020-06-28 | Disposition: A | Discharge status: Home / Self Care | Payer: No Typology Code available for payment source | Source: Ambulatory Visit | Attending: Student | Admitting: Student

## 2020-06-28 DIAGNOSIS — M25559 Pain in unspecified hip: Secondary | ICD-10-CM | POA: Insufficient documentation

## 2020-08-14 ENCOUNTER — Ambulatory Visit: Payer: No Typology Code available for payment source

## 2020-08-21 ENCOUNTER — Ambulatory Visit: Payer: No Typology Code available for payment source

## 2020-08-29 ENCOUNTER — Ambulatory Visit
Admission: EM | Admit: 2020-08-29 | Discharge: 2020-08-29 | Disposition: A | Discharge status: Home / Self Care | Payer: Medicaid Other | Attending: Family Medicine | Admitting: Family Medicine

## 2020-08-29 DIAGNOSIS — J0110 Acute frontal sinusitis, unspecified: Secondary | ICD-10-CM

## 2020-08-29 DIAGNOSIS — H6690 Otitis media, unspecified, unspecified ear: Secondary | ICD-10-CM

## 2020-08-29 MED ORDER — AMOXICILLIN-POT CLAVULANATE 875-125 MG PO TABS
1.0000 | ORAL_TABLET | Freq: Two times a day (BID) | ORAL | 0 refills | Status: DC
Start: 2020-08-29 — End: 2020-09-12

## 2020-08-29 NOTE — ED Notes (Signed)
Reports bilateral ear pain and x 1 week.  Throat irritation x 2 days.  No fevers.

## 2020-08-29 NOTE — Discharge Instructions (Addendum)
Treating you for a sinus infection Take the medicines as prescribed OTC medicines as needed  Follow up as needed for continued or worsening symptoms

## 2020-08-29 NOTE — ED Provider Notes (Signed)
[name]    CSN: [id number] Arrival date & time: 08/29/20  1307      History   Chief Complaint Chief Complaint  Patient presents with  . Otalgia    HPI Jennifer Wiggins is a 50 y.o. female.   50 year old female who presents today with complaints of bilateral ear pain, pressure, sinus congestion, thick green mucus, eye drainage bilateral symptoms been constant for the past 4 to 5 days.  History of recurrent sinus infections.  No fever, chills.  No cough     Past Medical History:  Diagnosis Date  . Anxiety   . Depression   . Uterine fibroid     There are no problems to display for this patient.   Past Surgical History:  Procedure Laterality Date  . CESAREAN SECTION    . TUBAL LIGATION      OB History    Gravida  5   Para      Term      Preterm      AB  1   Living  3     SAB  1   IAB      Ectopic      Multiple      Live Births               Home Medications    Prior to Admission medications   Medication Sig Start Date End Date Taking? Authorizing Provider  amoxicillin-clavulanate (AUGMENTIN) 875-125 MG tablet Take 1 tablet by mouth every 12 (twelve) hours. 08/29/20  Yes Nieves Chapa A, NP  celecoxib (CELEBREX) 100 MG capsule Take 100 mg by mouth 2 (two) times daily.    [provider]  cetirizine (ZYRTEC) 10 MG tablet Take 10 mg by mouth daily.    [provider]  cyclobenzaprine (FLEXERIL) 5 MG tablet Take 1 tablet (5 mg total) by mouth 3 (three) times daily as needed for muscle spasms. 02/02/20   Johnn Hai, PA-C  DULoxetine (CYMBALTA) 30 MG capsule Take 30 mg by mouth daily.    [provider]  fluticasone (FLONASE) 50 MCG/ACT nasal spray Place 2 sprays into both nostrils daily.    [provider]  gabapentin (NEURONTIN) 300 MG capsule Take by mouth. 01/14/20 06/15/20  [provider]  meloxicam (MOBIC) 15 MG tablet Take 15 mg by mouth daily.    [provider]  Misc  Natural Products (TURMERIC CURCUMIN) CAPS Take 1 capsule by mouth in the morning and at bedtime.    [provider]  tiZANidine (ZANAFLEX) 2 MG tablet Take 2 mg by mouth 3 (three) times daily.    [provider]  traZODone (DESYREL) 50 MG tablet Take 50 mg by mouth at bedtime.    [provider]  venlafaxine (EFFEXOR) 75 MG tablet Take 75 mg by mouth daily.    [provider]  buPROPion (WELLBUTRIN SR) 150 MG 12 hr tablet Take 150 mg by mouth 2 (two) times daily. Patient not taking: Reported on 06/15/2020  08/29/20  [provider]  ranitidine (ZANTAC) 75 MG tablet Take 75 mg by mouth 2 (two) times daily. Patient not taking: Reported on 06/15/2020  08/29/20  [provider]  sertraline (ZOLOFT) 25 MG tablet Take 25 mg by mouth daily. Patient not taking: Reported on 06/15/2020  08/29/20  [provider]    Family History Family History  Problem Relation Age of Onset  . Healthy Mother   . Other Father  MVA    Social History Social History   Tobacco Use  . Smoking status: Current Every Day Smoker    Packs/day: 0.25    Years: 16.00    Pack years: 4.00    Types: Cigarettes  . Smokeless tobacco: Never Used  Vaping Use  . Vaping Use: Never used  Substance Use Topics  . Alcohol use: Not Currently  . Drug use: Not Currently     Allergies   Latex   Review of Systems Review of Systems   Physical Exam Triage Vital Signs ED Triage Vitals [08/29/20 1317]  Enc Vitals Group     BP 110/76     Pulse Rate 89     Resp 18     Temp 98.7 F (37.1 C)     Temp Source Oral     SpO2 95 %     Weight      Height      Head Circumference      Peak Flow      Pain Score      Pain Loc      Pain Edu?      Excl. in [place]?    No data found.  Updated Vital Signs BP 110/76 (BP Location: Left Arm)   Pulse 89   Temp 98.7 F (37.1 C) (Oral)   Resp 18   LMP 05/13/2019   SpO2 95%   Visual Acuity Right Eye Distance:   Left  Eye Distance:   Bilateral Distance:    Right Eye Near:   Left Eye Near:    Bilateral Near:     Physical Exam Vitals and nursing note reviewed.  Constitutional:      General: She is not in acute distress.    Appearance: Normal appearance. She is not ill-appearing, toxic-appearing or diaphoretic.  HENT:     Head: Normocephalic.     Right Ear: Tympanic membrane is erythematous and bulging.     Left Ear: Tympanic membrane is erythematous and bulging.     Nose: Congestion present.     Mouth/Throat:     Pharynx: Oropharynx is clear.  Eyes:     Conjunctiva/sclera: Conjunctivae normal.  Cardiovascular:     Rate and Rhythm: Normal rate and regular rhythm.  Pulmonary:     Effort: Pulmonary effort is normal.     Breath sounds: Normal breath sounds.  Musculoskeletal:        General: Normal range of motion.     Cervical back: Normal range of motion.  Skin:    General: Skin is warm and dry.     Findings: No rash.  Neurological:     Mental Status: She is alert.  Psychiatric:        Mood and Affect: Mood normal.      UC Treatments / Results  Labs (all labs ordered are listed, but only abnormal results are displayed) Labs Reviewed - No data to display  EKG   Radiology No results found.  Procedures Procedures (including critical care time)  Medications Ordered in UC Medications - No data to display  Initial Impression / Assessment and Plan / UC Course  I have reviewed the triage vital signs and the nursing notes.  Pertinent labs & imaging results that were available during my care of the patient were reviewed by me and considered in my medical decision making (see chart for details).     Sinusitis and bilateral otitis media Treating with amoxicillin.  OTC medicines for symptoms.  Follow up  as needed for continued or worsening symptoms  Final Clinical Impressions(s) / UC Diagnoses   Final diagnoses:  Acute non-recurrent frontal sinusitis  Acute otitis media,  unspecified otitis media type     Discharge Instructions     Treating you for a sinus infection Take the medicines as prescribed OTC medicines as needed  Follow up as needed for continued or worsening symptoms     ED Prescriptions    Medication Sig Dispense Auth. Provider   amoxicillin-clavulanate (AUGMENTIN) 875-125 MG tablet Take 1 tablet by mouth every 12 (twelve) hours. 14 tablet Jenetta Wease A, NP     PDMP not reviewed this encounter.   Orvan July, NP 08/29/20 [phone]

## 2020-08-30 ENCOUNTER — Ambulatory Visit: Payer: Self-pay

## 2020-09-12 ENCOUNTER — Ambulatory Visit
Admission: EM | Admit: 2020-09-12 | Discharge: 2020-09-12 | Disposition: A | Discharge status: Home / Self Care | Payer: No Typology Code available for payment source | Attending: Internal Medicine | Admitting: Internal Medicine

## 2020-09-12 ENCOUNTER — Other Ambulatory Visit: Payer: Self-pay

## 2020-09-12 DIAGNOSIS — B3731 Acute candidiasis of vulva and vagina: Secondary | ICD-10-CM

## 2020-09-12 DIAGNOSIS — J358 Other chronic diseases of tonsils and adenoids: Secondary | ICD-10-CM

## 2020-09-12 DIAGNOSIS — H9202 Otalgia, left ear: Secondary | ICD-10-CM

## 2020-09-12 DIAGNOSIS — B373 Candidiasis of vulva and vagina: Secondary | ICD-10-CM

## 2020-09-12 MED ORDER — FLUCONAZOLE 150 MG PO TABS: 150.0000 mg | ORAL_TABLET | Freq: Every day | ORAL | 0 refills | Status: AC

## 2020-09-12 NOTE — ED Triage Notes (Signed)
Pt c/o recurrent left ear pain since Sunday after being exposed to sleet outside over the weekend for several minutes.  Pt states she completed ABX Rx on Friday for sinus infection.  Pt states when she "stuck her finger in the back of my mouth this morning, it feels like a bone back there.".  Also c/o vaginal "yeast infection" and is requesting Rx for yeast infection.  Denies fever, n/v/d, sore throat.

## 2020-09-12 NOTE — ED Provider Notes (Signed)
Jennifer Wiggins    CSN: [id number] Arrival date & time: 09/12/20  1208      History   Chief Complaint Chief Complaint  Patient presents with  . Otalgia    HPI Jennifer Wiggins is a 50 y.o. female who presents with multiple concerns  1- Hacing recurrent L ear pain x 2 days. Has finished all the antibiotic last week for sinusitis 2- she felt something hard on her L tonsil when she stuck her finger on her throat. 3- having vaginal itching which feels like has a yeast infection  And would like treatment.     Past Medical History:  Diagnosis Date  . Anxiety   . Depression   . Uterine fibroid     There are no problems to display for this patient.   Past Surgical History:  Procedure Laterality Date  . CESAREAN SECTION    . TUBAL LIGATION      OB History    Gravida  5   Para      Term      Preterm      AB  1   Living  3     SAB  1   IAB      Ectopic      Multiple      Live Births               Home Medications    Prior to Admission medications   Medication Sig Start Date End Date Taking? Authorizing Provider  celecoxib (CELEBREX) 100 MG capsule Take 100 mg by mouth 2 (two) times daily.   Yes [provider]  cetirizine (ZYRTEC) 10 MG tablet Take 10 mg by mouth daily.   Yes [provider]  cyclobenzaprine (FLEXERIL) 5 MG tablet Take 1 tablet (5 mg total) by mouth 3 (three) times daily as needed for muscle spasms. 02/02/20  Yes Johnn Hai, PA-C  DULoxetine (CYMBALTA) 30 MG capsule Take 30 mg by mouth daily.   Yes [provider]  fluconazole (DIFLUCAN) 150 MG tablet Take 1 tablet (150 mg total) by mouth daily. 09/12/20  Yes Rodriguez-Southworth, Sunday Spillers, PA-C  fluticasone (FLONASE) 50 MCG/ACT nasal spray Place 2 sprays into both nostrils daily.   Yes [provider]  traZODone (DESYREL) 50 MG tablet Take 50 mg by mouth at bedtime.   Yes [provider]  gabapentin (NEURONTIN) 300 MG capsule Take  by mouth. 01/14/20 06/15/20  [provider]  Misc Natural Products (TURMERIC CURCUMIN) CAPS Take 1 capsule by mouth in the morning and at bedtime.    [provider]  tiZANidine (ZANAFLEX) 2 MG tablet Take 2 mg by mouth 3 (three) times daily.    [provider]  buPROPion (WELLBUTRIN SR) 150 MG 12 hr tablet Take 150 mg by mouth 2 (two) times daily. Patient not taking: Reported on 06/15/2020  08/29/20  [provider]  ranitidine (ZANTAC) 75 MG tablet Take 75 mg by mouth 2 (two) times daily. Patient not taking: Reported on 06/15/2020  08/29/20  [provider]  sertraline (ZOLOFT) 25 MG tablet Take 25 mg by mouth daily. Patient not taking: Reported on 06/15/2020  08/29/20  [provider]  venlafaxine (EFFEXOR) 75 MG tablet Take 75 mg by mouth daily.  09/12/20  [provider]    Family History Family History  Problem Relation Age of Onset  . Healthy Mother   . Other Father        MVA  Social History Social History   Tobacco Use  . Smoking status: Current Every Day Smoker    Packs/day: 0.25    Years: 16.00    Pack years: 4.00    Types: Cigarettes  . Smokeless tobacco: Never Used  Vaping Use  . Vaping Use: Never used  Substance Use Topics  . Alcohol use: Not Currently  . Drug use: Not Currently     Allergies   Latex   Review of Systems Review of Systems  Constitutional: Negative for chills, diaphoresis and fever.  HENT: Positive for ear discharge and postnasal drip. Negative for sore throat and trouble swallowing.   Respiratory: Negative for cough.   Genitourinary: Negative for difficulty urinating, vaginal discharge and vaginal pain.  Musculoskeletal: Negative for gait problem and myalgias.  Skin: Negative for rash.  Neurological: Negative for headaches.     Physical Exam Triage Vital Signs ED Triage Vitals  Enc Vitals Group     BP 09/12/20 1220 136/90     Pulse Rate 09/12/20 1220 79     Resp 09/12/20  1220 18     Temp 09/12/20 1220 98.6 F (37 C)     Temp Source 09/12/20 1220 Oral     SpO2 09/12/20 1220 95 %     Weight --      Height --      Head Circumference --      Peak Flow --      Pain Score 09/12/20 1231 6     Pain Loc --      Pain Edu? --      Excl. in [place]? --    No data found.  Updated Vital Signs BP 136/90 (BP Location: Right Arm)   Pulse 79   Temp 98.6 F (37 C) (Oral)   Resp 18   LMP 05/13/2019   SpO2 95%   Visual Acuity Right Eye Distance:   Left Eye Distance:   Bilateral Distance:    Right Eye Near:   Left Eye Near:    Bilateral Near:     Physical Exam Constitutional:      General: She is not in acute distress.    Appearance: She is obese. She is not toxic-appearing.  HENT:     Right Ear: Tympanic membrane, ear canal and external ear normal.     Left Ear: Tympanic membrane, ear canal and external ear normal.     Nose: Nose normal.     Mouth/Throat:     Mouth: Mucous membranes are moist.     Pharynx: No oropharyngeal exudate or posterior oropharyngeal erythema.     Comments: Seems to have a tonsil stone on the L tonsil which I could only get a small portion out.  Eyes:     General: No scleral icterus.    Conjunctiva/sclera: Conjunctivae normal.  Pulmonary:     Effort: Pulmonary effort is normal.  Musculoskeletal:        General: Normal range of motion.     Cervical back: Neck supple.  Lymphadenopathy:     Cervical: No cervical adenopathy.  Skin:    General: Skin is warm and dry.  Neurological:     Mental Status: She is alert and oriented to person, place, and time.     Gait: Gait normal.  Psychiatric:        Mood and Affect: Mood normal.        Behavior: Behavior normal.        Thought Content: Thought content normal.  Judgment: Judgment normal.      UC Treatments / Results  Labs (all labs ordered are listed, but only abnormal results are displayed) Labs Reviewed - No data to display  EKG   Radiology No results  found.  Procedures Procedures (including critical care time)  Medications Ordered in UC Medications - No data to display  Initial Impression / Assessment and Plan / UC Course  I have reviewed the triage vital signs and the nursing notes. I sent Diflucan for yeast See instructions.  Final Clinical Impressions(s) / UC Diagnoses   Final diagnoses:  Tonsil stone  Otalgia of left ear     Discharge Instructions     Gargle with warm salt water 3-4 times a day to loosen the left tonsil stone If it persists go see a ear nose and throat doctor which I have attached information on this paper work.     ED Prescriptions    Medication Sig Dispense Auth. Provider   fluconazole (DIFLUCAN) 150 MG tablet Take 1 tablet (150 mg total) by mouth daily. 2 tablet Rodriguez-Southworth, Sunday Spillers, PA-C     PDMP not reviewed this encounter.   Shelby Mattocks, [place] 09/12/20 1339

## 2020-09-12 NOTE — Discharge Instructions (Addendum)
Gargle with warm salt water 3-4 times a day to loosen the left tonsil stone If it persists go see a ear nose and throat doctor which I have attached information on this paper work.

## 2020-10-27 ENCOUNTER — Other Ambulatory Visit: Payer: Self-pay

## 2020-10-27 ENCOUNTER — Emergency Department
Admission: EM | Admit: 2020-10-27 | Discharge: 2020-10-28 | Disposition: A | Discharge status: Home / Self Care | Payer: No Typology Code available for payment source | Attending: Emergency Medicine | Admitting: Emergency Medicine

## 2020-10-27 DIAGNOSIS — Z91040 Latex allergy status: Secondary | ICD-10-CM | POA: Insufficient documentation

## 2020-10-27 DIAGNOSIS — R5383 Other fatigue: Secondary | ICD-10-CM | POA: Insufficient documentation

## 2020-10-27 DIAGNOSIS — F17210 Nicotine dependence, cigarettes, uncomplicated: Secondary | ICD-10-CM | POA: Insufficient documentation

## 2020-10-27 DIAGNOSIS — R531 Weakness: Secondary | ICD-10-CM | POA: Insufficient documentation

## 2020-10-27 DIAGNOSIS — G8929 Other chronic pain: Secondary | ICD-10-CM

## 2020-10-27 DIAGNOSIS — M5440 Lumbago with sciatica, unspecified side: Secondary | ICD-10-CM | POA: Insufficient documentation

## 2020-10-27 LAB — URINALYSIS, COMPLETE (UACMP) WITH MICROSCOPIC
Bacteria, UA: NONE SEEN
Bilirubin Urine: NEGATIVE
Glucose, UA: NEGATIVE mg/dL
Hgb urine dipstick: NEGATIVE
Ketones, ur: 5 mg/dL — AB
Nitrite: NEGATIVE
Protein, ur: 100 mg/dL — AB
Specific Gravity, Urine: 1.027 (ref 1.005–1.030)
pH: 5 (ref 5.0–8.0)

## 2020-10-27 LAB — CBC
HCT: 45.7 % (ref 36.0–46.0)
Hemoglobin: 15 g/dL (ref 12.0–15.0)
MCH: 28.8 pg (ref 26.0–34.0)
MCHC: 32.8 g/dL (ref 30.0–36.0)
MCV: 87.7 fL (ref 80.0–100.0)
Platelets: 181 10*3/uL (ref 150–400)
RBC: 5.21 MIL/uL — ABNORMAL HIGH (ref 3.87–5.11)
RDW: 14 % (ref 11.5–15.5)
WBC: 17.5 10*3/uL — ABNORMAL HIGH (ref 4.0–10.5)
nRBC: 0 % (ref 0.0–0.2)

## 2020-10-27 LAB — BASIC METABOLIC PANEL
Anion gap: 7 (ref 5–15)
BUN: 12 mg/dL (ref 6–20)
CO2: 20 mmol/L — ABNORMAL LOW (ref 22–32)
Calcium: 9.2 mg/dL (ref 8.9–10.3)
Chloride: 109 mmol/L (ref 98–111)
Creatinine, Ser: 1.19 mg/dL — ABNORMAL HIGH (ref 0.44–1.00)
GFR, Estimated: 56 mL/min — ABNORMAL LOW (ref >60)
Glucose, Bld: 92 mg/dL (ref 70–99)
Potassium: 4.5 mmol/L (ref 3.5–5.1)
Sodium: 136 mmol/L (ref 135–145)

## 2020-10-27 LAB — CBG MONITORING, ED: Glucose-Capillary: 91 mg/dL (ref 70–99)

## 2020-10-27 LAB — TROPONIN I (HIGH SENSITIVITY): Troponin I (High Sensitivity): 6 ng/L (ref <18)

## 2020-10-27 MED ORDER — SODIUM CHLORIDE 0.9 % IV SOLN
1000.0000 mL | Freq: Once | INTRAVENOUS | Status: AC
  Administered 2020-10-28: 1000 mL via INTRAVENOUS

## 2020-10-27 MED ORDER — KETOROLAC TROMETHAMINE 30 MG/ML IJ SOLN
30.0000 mg | Freq: Once | INTRAMUSCULAR | Status: AC
  Administered 2020-10-28: 30 mg via INTRAVENOUS
  Filled 2020-10-27: qty 1

## 2020-10-27 NOTE — ED Notes (Signed)
Pt sleeping in lobby, pt awoken by this rn to check on pt. Pt states "that's all you want?"

## 2020-10-27 NOTE — ED Notes (Signed)
Pt ambulatory outside in no acute distress.
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
## 2020-10-28 NOTE — ED Provider Notes (Signed)
[facility] Emergency Department Provider Note   ____________________________________________    I have reviewed the triage vital signs and the nursing notes.   HISTORY  Chief Complaint Fatigue, feels dehydrated    HPI Jennifer Wiggins is a 50 y.o. female with history as noted below who presents with complaints of chronic back pain and a sense of being dehydrated over the last several days.  She reports she is taking p.o., denies nausea vomiting or diarrhea but feels somewhat fatigued and "out of it ".  Denies fevers chills, no abdominal pain, no new medications.  She thinks that she needs IV fluids  Past Medical History:  Diagnosis Date  . Anxiety   . Depression   . Uterine fibroid     There are no problems to display for this patient.   Past Surgical History:  Procedure Laterality Date  . CESAREAN SECTION    . TUBAL LIGATION      Prior to Admission medications   Medication Sig Start Date End Date Taking? Authorizing Provider  celecoxib (CELEBREX) 100 MG capsule Take 100 mg by mouth 2 (two) times daily.    [provider]  cetirizine (ZYRTEC) 10 MG tablet Take 10 mg by mouth daily.    [provider]  cyclobenzaprine (FLEXERIL) 5 MG tablet Take 1 tablet (5 mg total) by mouth 3 (three) times daily as needed for muscle spasms. 02/02/20   Johnn Hai, PA-C  DULoxetine (CYMBALTA) 30 MG capsule Take 30 mg by mouth daily.    [provider]  fluconazole (DIFLUCAN) 150 MG tablet Take 1 tablet (150 mg total) by mouth daily. 09/12/20   Rodriguez-Southworth, Sunday Spillers, PA-C  fluticasone (FLONASE) 50 MCG/ACT nasal spray Place 2 sprays into both nostrils daily.    [provider]  gabapentin (NEURONTIN) 300 MG capsule Take by mouth. 01/14/20 06/15/20  [provider]  Misc Natural Products (TURMERIC CURCUMIN) CAPS Take 1 capsule by mouth in the morning and at bedtime.    [provider]  tiZANidine  (ZANAFLEX) 2 MG tablet Take 2 mg by mouth 3 (three) times daily.    [provider]  traZODone (DESYREL) 50 MG tablet Take 50 mg by mouth at bedtime.    [provider]  buPROPion (WELLBUTRIN SR) 150 MG 12 hr tablet Take 150 mg by mouth 2 (two) times daily. Patient not taking: Reported on 06/15/2020  08/29/20  [provider]  ranitidine (ZANTAC) 75 MG tablet Take 75 mg by mouth 2 (two) times daily. Patient not taking: Reported on 06/15/2020  08/29/20  [provider]  sertraline (ZOLOFT) 25 MG tablet Take 25 mg by mouth daily. Patient not taking: Reported on 06/15/2020  08/29/20  [provider]  venlafaxine (EFFEXOR) 75 MG tablet Take 75 mg by mouth daily.  09/12/20  [provider]     Allergies Latex  Family History  Problem Relation Age of Onset  . Healthy Mother   . Other Father        MVA    Social History Social History   Tobacco Use  . Smoking status: Current Every Day Smoker    Packs/day: 0.25    Years: 16.00    Pack years: 4.00    Types: Cigarettes  . Smokeless tobacco: Never Used  Vaping Use  . Vaping Use: Never used  Substance Use Topics  . Alcohol use: Not Currently  . Drug use: Not Currently    Review of Systems  Constitutional:  As above Eyes: No visual changes.  ENT: No sore throat. Cardiovascular: Denies chest pain. Respiratory: Denies shortness of breath. Gastrointestinal: No abdominal pain.  No nausea, no vomiting.   Genitourinary: Negative for dysuria. Musculoskeletal: Chronic back pain Skin: Negative for rash. Neurological: Negative for headaches or weakness   ____________________________________________   PHYSICAL EXAM:  VITAL SIGNS: ED Triage Vitals  Enc Vitals Group     BP 10/27/20 1914 108/65     Pulse Rate 10/27/20 1914 (!) 108     Resp 10/27/20 1914 18     Temp 10/27/20 1914 98.7 F (37.1 C)     Temp Source 10/27/20 1914 Oral     SpO2 10/27/20 1914 100 %     Weight 10/27/20 1916  81.6 kg (180 lb)     Height 10/27/20 1916 1.626 m (5\' 4" )     Head Circumference --      Peak Flow --      Pain Score 10/27/20 1915 10     Pain Loc --      Pain Edu? --      Excl. in [place]? --     Constitutional: Alert and oriented. No acute distress.   Nose: No congestion/rhinnorhea. Mouth/Throat: Mucous membranes are moist.    Cardiovascular: Normal rate, regular rhythm. Grossly normal heart sounds.  Good peripheral circulation. Respiratory: Normal respiratory effort.  No retractions. Lungs CTAB. Gastrointestinal: Soft and nontender. No distention.  No CVA tenderness.  Musculoskeletal: Warm and well perfused Neurologic:  Normal speech and language. No gross focal neurologic deficits are appreciated.  Skin:  Skin is warm, dry and intact. No rash noted. Psychiatric: Mood and affect are normal. Speech and behavior are normal.  ____________________________________________   LABS (all labs ordered are listed, but only abnormal results are displayed)  Labs Reviewed  BASIC METABOLIC PANEL - Abnormal; Notable for the following components:      Result Value   CO2 20 (*)    Creatinine, Ser 1.19 (*)    GFR, Estimated 56 (*)    All other components within normal limits  CBC - Abnormal; Notable for the following components:   WBC 17.5 (*)    RBC 5.21 (*)    All other components within normal limits  URINALYSIS, COMPLETE (UACMP) WITH MICROSCOPIC - Abnormal; Notable for the following components:   Color, Urine AMBER (*)    APPearance CLOUDY (*)    Ketones, ur 5 (*)    Protein, ur 100 (*)    Leukocytes,Ua TRACE (*)    All other components within normal limits  CBG MONITORING, ED  POC URINE PREG, ED  TROPONIN I (HIGH SENSITIVITY)   ____________________________________________  EKG  ED ECG REPORT I, Lavonia Drafts, the attending physician, personally viewed and interpreted this ECG.  Date: 10/28/2020  Rhythm: normal sinus rhythm QRS Axis: normal Intervals: normal ST/T Wave  abnormalities: normal Narrative Interpretation: no evidence of acute ischemia  ____________________________________________  RADIOLOGY  None ____________________________________________   PROCEDURES  Procedure(s) performed: No  Procedures   Critical Care performed: No ____________________________________________   INITIAL IMPRESSION / ASSESSMENT AND PLAN / ED COURSE  Pertinent labs & imaging results that were available during my care of the patient were reviewed by me and considered in my medical decision making (see chart for details).  Patient presents with chronic back pain for which she takes meloxicam, she states that this seems to work relatively well.  Complains of some fatigue and a sense of dehydration.  Lab work is overall reassuring,  exam is unremarkable, vitals are normal  IV fluid bolus given with improvement, appropriate discharge at this time with outpatient follow-up    ____________________________________________   FINAL CLINICAL IMPRESSION(S) / ED DIAGNOSES  Final diagnoses:  Generalized weakness  Chronic midline low back pain with sciatica, sciatica laterality unspecified        Note:  This document was prepared using Dragon voice recognition software and may include unintentional dictation errors.   Lavonia Drafts, MD 10/28/20 [phone]

## 2020-11-10 ENCOUNTER — Other Ambulatory Visit: Payer: Self-pay | Admitting: Student

## 2020-11-13 ENCOUNTER — Other Ambulatory Visit: Payer: Self-pay

## 2020-11-13 MED FILL — Tizanidine HCl Tab 2 MG (Base Equivalent): ORAL | 30 days supply | Qty: 90 | Fill #0 | Status: AC

## 2020-12-04 ENCOUNTER — Other Ambulatory Visit: Payer: Self-pay

## 2020-12-19 ENCOUNTER — Other Ambulatory Visit: Payer: Self-pay

## 2020-12-19 MED FILL — Meloxicam Tab 15 MG: ORAL | 14 days supply | Qty: 14 | Fill #0 | Status: AC

## 2020-12-21 ENCOUNTER — Other Ambulatory Visit: Payer: Self-pay

## 2020-12-29 ENCOUNTER — Other Ambulatory Visit: Payer: Self-pay

## 2020-12-29 MED FILL — Omeprazole Cap Delayed Release 20 MG: ORAL | 30 days supply | Qty: 30 | Fill #0 | Status: AC

## 2021-01-12 ENCOUNTER — Telehealth: Payer: Self-pay | Admitting: Pharmacy Technician

## 2021-01-12 NOTE — Telephone Encounter (Signed)
Received updated proof of income.  Patient eligible to receive medication assistance at [facility] until time for re-certification in 1610, and as long as eligibility requirements continue to be met.  [facility]

## 2021-01-15 ENCOUNTER — Other Ambulatory Visit: Payer: Self-pay

## 2021-01-22 ENCOUNTER — Other Ambulatory Visit: Payer: Self-pay

## 2021-01-22 MED FILL — Meloxicam Tab 15 MG: ORAL | 30 days supply | Qty: 30 | Fill #1 | Status: AC

## 2021-01-22 MED FILL — Celecoxib Cap 100 MG: ORAL | 30 days supply | Qty: 60 | Fill #0 | Status: AC

## 2021-01-22 MED FILL — Tizanidine HCl Tab 2 MG (Base Equivalent): ORAL | 30 days supply | Qty: 90 | Fill #1 | Status: AC

## 2021-01-22 MED FILL — Nortriptyline HCl Cap 10 MG: ORAL | 30 days supply | Qty: 60 | Fill #0 | Status: AC

## 2021-01-22 MED FILL — Omeprazole Cap Delayed Release 20 MG: ORAL | 30 days supply | Qty: 30 | Fill #1 | Status: AC

## 2021-01-23 ENCOUNTER — Other Ambulatory Visit: Payer: Self-pay

## 2021-01-23 ENCOUNTER — Encounter: Payer: Self-pay | Admitting: Emergency Medicine

## 2021-01-23 ENCOUNTER — Ambulatory Visit
Admission: EM | Admit: 2021-01-23 | Discharge: 2021-01-23 | Disposition: A | Discharge status: Home / Self Care | Payer: Self-pay | Attending: Emergency Medicine | Admitting: Emergency Medicine

## 2021-01-23 DIAGNOSIS — R4689 Other symptoms and signs involving appearance and behavior: Secondary | ICD-10-CM

## 2021-01-23 DIAGNOSIS — J0100 Acute maxillary sinusitis, unspecified: Secondary | ICD-10-CM

## 2021-01-23 DIAGNOSIS — Z1152 Encounter for screening for COVID-19: Secondary | ICD-10-CM

## 2021-01-23 MED ORDER — AMOXICILLIN 500 MG PO CAPS
500.0000 mg | ORAL_CAPSULE | Freq: Three times a day (TID) | ORAL | 0 refills | Status: AC
  Filled 2021-01-23: qty 21, 7d supply, fill #0

## 2021-01-23 NOTE — ED Triage Notes (Signed)
Pt presents today with c/o "I think I have a sinus infection". She c/o nasal congestion and cough x 2 weeks. Denies fever. Requests Covid test.
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
## 2021-01-23 NOTE — Discharge Instructions (Addendum)
Take the amoxicillin as directed.  Follow up with your primary care provider if your symptoms are not improving.   ° ° °

## 2021-01-24 ENCOUNTER — Other Ambulatory Visit: Payer: Self-pay

## 2021-01-24 LAB — SARS-COV-2, NAA 2 DAY TAT

## 2021-01-24 LAB — NOVEL CORONAVIRUS, NAA: SARS-CoV-2, NAA: NOT DETECTED

## 2021-01-25 ENCOUNTER — Other Ambulatory Visit: Payer: Self-pay

## 2021-01-29 ENCOUNTER — Other Ambulatory Visit: Payer: Self-pay

## 2021-01-31 ENCOUNTER — Other Ambulatory Visit: Payer: Self-pay

## 2021-02-02 ENCOUNTER — Other Ambulatory Visit: Payer: Self-pay

## 2021-02-05 ENCOUNTER — Other Ambulatory Visit: Payer: Self-pay

## 2021-02-08 ENCOUNTER — Other Ambulatory Visit: Payer: Self-pay

## 2021-02-14 ENCOUNTER — Other Ambulatory Visit: Payer: Self-pay

## 2021-02-19 ENCOUNTER — Other Ambulatory Visit: Payer: Self-pay

## 2021-02-19 MED FILL — Duloxetine HCl Enteric Coated Pellets Cap 30 MG (Base Eq): ORAL | 30 days supply | Qty: 30 | Fill #0 | Status: CN

## 2021-02-20 ENCOUNTER — Other Ambulatory Visit: Payer: Self-pay

## 2021-04-03 ENCOUNTER — Other Ambulatory Visit: Payer: Self-pay

## 2021-04-19 ENCOUNTER — Other Ambulatory Visit: Payer: Self-pay

## 2021-04-19 MED FILL — Tizanidine HCl Tab 2 MG (Base Equivalent): ORAL | 30 days supply | Qty: 90 | Fill #2 | Status: AC

## 2021-04-19 MED FILL — Meloxicam Tab 15 MG: ORAL | 30 days supply | Qty: 30 | Fill #2 | Status: AC

## 2021-04-19 MED FILL — Duloxetine HCl Enteric Coated Pellets Cap 30 MG (Base Eq): ORAL | 30 days supply | Qty: 30 | Fill #0 | Status: AC

## 2021-04-19 MED FILL — Cetirizine HCl Tab 10 MG: ORAL | 90 days supply | Qty: 90 | Fill #0 | Status: AC

## 2021-04-20 ENCOUNTER — Other Ambulatory Visit: Payer: Self-pay

## 2021-04-23 ENCOUNTER — Other Ambulatory Visit: Payer: Self-pay

## 2021-04-24 ENCOUNTER — Other Ambulatory Visit: Payer: Self-pay
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
## 2021-04-24 MED ORDER — OMEPRAZOLE 20 MG PO CPDR
DELAYED_RELEASE_CAPSULE | ORAL | 1 refills | Status: DC
  Filled 2021-04-24: qty 90, 90d supply, fill #0

## 2021-04-25 ENCOUNTER — Other Ambulatory Visit: Payer: Self-pay

## 2021-04-27 ENCOUNTER — Other Ambulatory Visit: Payer: Self-pay

## 2021-05-02 ENCOUNTER — Other Ambulatory Visit: Payer: Self-pay

## 2021-05-08 ENCOUNTER — Other Ambulatory Visit: Payer: Self-pay

## 2021-05-09 ENCOUNTER — Other Ambulatory Visit: Payer: Self-pay

## 2021-05-14 ENCOUNTER — Other Ambulatory Visit: Payer: Self-pay

## 2021-05-16 ENCOUNTER — Other Ambulatory Visit: Payer: Self-pay

## 2021-05-22 ENCOUNTER — Ambulatory Visit
Admission: EM | Admit: 2021-05-22 | Discharge: 2021-05-22 | Disposition: A | Discharge status: Home / Self Care | Payer: Medicaid Other | Attending: Emergency Medicine | Admitting: Emergency Medicine

## 2021-05-22 ENCOUNTER — Other Ambulatory Visit: Payer: Self-pay

## 2021-05-22 ENCOUNTER — Encounter: Payer: Self-pay | Admitting: Emergency Medicine

## 2021-05-22 DIAGNOSIS — J0100 Acute maxillary sinusitis, unspecified: Secondary | ICD-10-CM

## 2021-05-22 DIAGNOSIS — Z9109 Other allergy status, other than to drugs and biological substances: Secondary | ICD-10-CM

## 2021-05-22 MED ORDER — BENZONATATE 100 MG PO CAPS
200.0000 mg | ORAL_CAPSULE | Freq: Three times a day (TID) | ORAL | 0 refills | Status: DC | PRN
  Filled 2021-05-22: qty 60, 10d supply, fill #0

## 2021-05-22 MED ORDER — AMOXICILLIN-POT CLAVULANATE ER 1000-62.5 MG PO TB12
2.0000 | ORAL_TABLET | Freq: Two times a day (BID) | ORAL | 0 refills | Status: DC
  Filled 2021-05-22: qty 28, 7d supply, fill #0

## 2021-05-22 MED ORDER — AMOXICILLIN-POT CLAVULANATE 875-125 MG PO TABS
1.0000 | ORAL_TABLET | Freq: Two times a day (BID) | ORAL | 0 refills | Status: DC
  Filled 2021-05-22: qty 14, 7d supply, fill #0

## 2021-05-22 NOTE — ED Triage Notes (Signed)
Pt c/o cough, chest congestion, runny nose x 2 weeks

## 2021-05-22 NOTE — Discharge Instructions (Addendum)
I suspect that your symptoms are coming from allergies and that you have a sinus infection.  Flonase, start saline nasal irrigation with a Milta Deiters Med rinse and distilled water as often as you want.  discontinue Claritin and start Zyrtec-D or Allegra-D.  Tessalon will help with cough.  Here is a list of primary care providers who are taking new patients:  Dr. Otilio Miu [address] [place] [address] [place] [facility] [place], [place] [address] [phone]  [place] [address]  [phone]  [facility] [address] [place], [place] [address] [phone]  [facility] [place]  [phone] [place], [place] [address]  Here are clinics/ other resources who will see you if you do not have insurance. Some have certain criteria that you must meet. Call them and find out what they are:  [facility]: [address], [place], [place] [address] Phone: [phone] Hours: First and Third Saturdays of each Month, 9 a.m. - 1 p.m.  [facility]: [address] [place], [place], [place] [address] Phone: [phone] Hours: Tuesday, 4 p.m. - 8 p.m. Thursday, 1 p.m. - 8 p.m. Wednesday, 9 a.m. - [facility] [address], [place], [place] [address] Phone: [phone] Pharmacy Phone Number: [phone] Dental Phone Number: [phone] [place] Help: [phone]  Dental Hours: Monday - Thursday, 8 a.m. - 6 p.m.  [place] [address], [place], [place] [address] Phone: [phone] Pharmacy Phone Number: [phone] [facility] Insurance Help: [phone]  [facility] [address] [place], [place] [address] Phone: [phone] Pharmacy Phone Number: [phone] [facility] Insurance Help: [phone]  [facility] [address] [place], [place]  [address] Phone: [phone] [facility] Insurance Help: [phone]   [address] [place], [place] [address] Phone: [phone]  Go to www.goodrx.com  or www.costplusdrugs.com to look up your medications. This will give you a list of where you can find your prescriptions at the most affordable prices. Or ask the pharmacist what the cash price is, or if they have any other discount programs available to help make your medication more affordable. This can be less expensive than what you would pay with insurance.

## 2021-05-22 NOTE — ED Provider Notes (Signed)
HPI  SUBJECTIVE:  Jennifer Wiggins is a 50 y.o. female who presents with 1 month of nasal congestion, sinus pain and pressure, rhinorrhea, postnasal drip, facial swelling underneath her eyes.  She reports 3 to 4 days of right ear pain/itching with waxy otorrhea.  She also reports an occasional cough with wheezing and a hoarse voice.  No shortness of breath, fevers, upper dental pain, change in hearing.  No GERD symptoms.  She is reporting allergy symptoms of watery eyes and itching.  No antibiotics in the past month.  No antipyretic in the past 6 hours.  She has tried Flonase, [employer], Claritin.  The Flonase and NyQuil helped.  No aggravating factors.  She also reports intermittent episodes of feeling "off balance/clumsy/wobbly" for the past week and a half.  This lasts several minutes and resolves.  No associated nausea, vomiting, headaches, palpitations, chest pain, arm or leg weakness, facial droop, slurred speech, tinnitus.  She has not tried anything for this feeling of disequilibrium.  There are no alleviating factors.  it is worse with going from sitting to standing.  She has a past medical history of frequent sinusitis, otitis media, hypertension, smoking, GERD, allergies for which she takes Claritin and Flonase.  No history of CVA, atrial fibrillation, vertigo, pulmonary disease.  LMP: She is status post hysterectomy.  PMD: None.    Past Medical History:  Diagnosis Date   Anxiety    Depression    Uterine fibroid     Past Surgical History:  Procedure Laterality Date   CESAREAN SECTION     TUBAL LIGATION      Family History  Problem Relation Age of Onset   Healthy Mother    Other Father        MVA    Social History   Tobacco Use   Smoking status: Every Day    Packs/day: 0.25    Years: 16.00    Pack years: 4.00    Types: Cigarettes   Smokeless tobacco: Never  Vaping Use   Vaping Use: Never used  Substance Use Topics   Alcohol use: Not Currently   Drug use: Not Currently     No current facility-administered medications for this encounter.  Current Outpatient Medications:    amoxicillin-clavulanate (AUGMENTIN) 875-125 MG tablet, Take 1 tablet by mouth 2 (two) times daily. X 7 days, Disp: 14 tablet, Rfl: 0   benzonatate (TESSALON) 200 MG capsule, Take 1 capsule (200 mg total) by mouth 3 (three) times daily as needed for cough., Disp: 30 capsule, Rfl: 0   celecoxib (CELEBREX) 100 MG capsule, Take 100 mg by mouth 2 (two) times daily., Disp: , Rfl:    DULoxetine (CYMBALTA) 30 MG capsule, Take 30 mg by mouth daily., Disp: , Rfl:    gabapentin (NEURONTIN) 300 MG capsule, Take by mouth., Disp: , Rfl:    meloxicam (MOBIC) 15 MG tablet, TAKE ONE TABLET BY MOUTH EVERY DAY FOR PAIN. DO NOT TAKE ON THE SAME DAY AS CELECOXIB., Disp: 90 tablet, Rfl: 2   omeprazole (PRILOSEC) 20 MG capsule, TAKE ONE CAPSULE BY MOUTH ONCE EVERY DAY FOR REFLUX., Disp: 90 capsule, Rfl: 1   tiZANidine (ZANAFLEX) 2 MG tablet, Take 2 mg by mouth 3 (three) times daily., Disp: , Rfl:    traZODone (DESYREL) 50 MG tablet, Take 50 mg by mouth at bedtime., Disp: , Rfl:    cyclobenzaprine (FLEXERIL) 5 MG tablet, TAKE ONE TABLET BY MOUTH AT BEDTIME AS NEEDED FOR MUSCLE PAIN, Disp: 90 tablet, Rfl:  3   DULoxetine (CYMBALTA) 30 MG capsule, TAKE ONE CAPSULE BY MOUTH ONCE A DAY FOR MOOD, Disp: 90 capsule, Rfl: 3   fluconazole (DIFLUCAN) 150 MG tablet, Take 1 tablet (150 mg total) by mouth daily., Disp: 2 tablet, Rfl: 0   gabapentin (NEURONTIN) 300 MG capsule, TAKE 2 CAPSULES BY MOUTH 3 TIMES A DAY. INCREASE DOSE FOR TITRATION., Disp: 180 capsule, Rfl: 3   Misc Natural Products (TURMERIC CURCUMIN) CAPS, Take 1 capsule by mouth in the morning and at bedtime., Disp: , Rfl:    tiZANidine (ZANAFLEX) 2 MG tablet, TAKE ONE TABLET BY MOUTH 3 TIMES A DAY FOR MUSCLE SPASTICITY. DO NOT TAKE AT THE SAME TIME AS CYCLOBENZAPRINE, Disp: 90 tablet, Rfl: 3   venlafaxine XR (EFFEXOR-XR) 37.5 MG 24 hr capsule, TAKE ONE CAPSULE BY  MOUTH EVERY DAY, Disp: 90 capsule, Rfl: 3  Allergies  Allergen Reactions   Latex Itching and Rash     ROS  As noted in HPI.   Physical Exam  BP (!) 156/89 (BP Location: Left Arm)   Pulse 71   Temp 98.6 F (37 C) (Oral)   Resp 16   LMP 05/13/2019   SpO2 95%   Constitutional: Well developed, well nourished, no acute distress Eyes: PERRL, EOMI, conjunctiva normal bilaterally HENT: Normocephalic, atraumatic,mucus membranes moist.  Purulent nasal congestion.  Erythematous, swollen turbinates.  Positive maxillary sinus tenderness.  No obvious postnasal drip.  Decreased hearing right ear compared to the left.  No pain with traction on pinna, palpation of tragus, mastoid.  Bilateral external ears, EACs, TMs normal.  No air-fluid levels.  TMJ nontender bilaterally Respiratory: Clear to auscultation bilaterally, no rales, no wheezing, no rhonchi Cardiovascular: Normal rate and rhythm, no murmurs, no gallops, no rubs GI: Nondistended skin: No rash, skin intact Musculoskeletal: No edema, no tenderness, no deformities Neurologic: Alert & oriented x 3, CN III-XII grossly intact, no motor deficits, sensation grossly intact,  coordination grossly normal, Romberg negative, tandem gait steady. Psychiatric: Speech and behavior appropriate   ED Course   Medications - No data to display  No orders of the defined types were placed in this encounter.  No results found for this or any previous visit (from the past 24 hour(s)). No results found.  ED Clinical Impression  1. Acute non-recurrent maxillary sinusitis   2. Environmental allergies      ED Assessment/Plan  Suspect patient has a sinusitis secondary to allergies.  Given duration of symptoms, will send home with Augmentin.  The off-balance sensation does not appear to be of central origin.  Suspect eustachian tube dysfunction.  She is to continue Flonase, start saline nasal irrigation.  She is to discontinue Claritin and start  Zyrtec-D or Allegra-D.  Patient is also requesting something for cough, so will send her home with Tessalon.  Will provide primary care list and order assistance in finding a PMD.  Discussed MDM, treatment plan, and plan for follow-up with patient . patient agrees with plan.   Meds ordered this encounter  Medications   DISCONTD: amoxicillin-clavulanate (AUGMENTIN XR) 1000-62.5 MG 12 hr tablet    Sig: Take 2 tablets by mouth 2 (two) times daily. X 7 days    Dispense:  28 tablet    Refill:  0   benzonatate (TESSALON) 200 MG capsule    Sig: Take 1 capsule (200 mg total) by mouth 3 (three) times daily as needed for cough.    Dispense:  30 capsule    Refill:  0  amoxicillin-clavulanate (AUGMENTIN) 875-125 MG tablet    Sig: Take 1 tablet by mouth 2 (two) times daily. X 7 days    Dispense:  14 tablet    Refill:  0      *This clinic note was created using [employer]. Therefore, there may be occasional mistakes despite careful proofreading. ?    Melynda Ripple, MD 05/23/21 [phone]

## 2021-05-24 ENCOUNTER — Other Ambulatory Visit: Payer: Self-pay

## 2021-06-05 ENCOUNTER — Other Ambulatory Visit: Payer: Self-pay

## 2021-06-05 ENCOUNTER — Ambulatory Visit
Admission: RE | Admit: 2021-06-05 | Discharge: 2021-06-05 | Disposition: A | Discharge status: Home / Self Care | Payer: Self-pay | Source: Ambulatory Visit | Attending: Urgent Care | Admitting: Urgent Care

## 2021-06-05 ENCOUNTER — Ambulatory Visit (INDEPENDENT_AMBULATORY_CARE_PROVIDER_SITE_OTHER): Payer: Self-pay

## 2021-06-05 VITALS — BP 150/94 | HR 67 | Temp 98.3°F

## 2021-06-05 DIAGNOSIS — H1031 Unspecified acute conjunctivitis, right eye: Secondary | ICD-10-CM

## 2021-06-05 DIAGNOSIS — R053 Chronic cough: Secondary | ICD-10-CM

## 2021-06-05 DIAGNOSIS — Z973 Presence of spectacles and contact lenses: Secondary | ICD-10-CM

## 2021-06-05 DIAGNOSIS — F17200 Nicotine dependence, unspecified, uncomplicated: Secondary | ICD-10-CM

## 2021-06-05 DIAGNOSIS — R062 Wheezing: Secondary | ICD-10-CM

## 2021-06-05 MED ORDER — TOBRAMYCIN 0.3 % OP SOLN
1.0000 [drp] | OPHTHALMIC | 0 refills | Status: AC
  Filled 2021-06-05: qty 3, 10d supply, fill #0
  Filled 2021-06-06: qty 5, 17d supply, fill #0

## 2021-06-05 MED ORDER — MONTELUKAST SODIUM 10 MG PO TABS
10.0000 mg | ORAL_TABLET | Freq: Every day | ORAL | 0 refills | Status: AC
  Filled 2021-06-05: qty 90, 90d supply, fill #0

## 2021-06-05 MED ORDER — ALBUTEROL SULFATE HFA 108 (90 BASE) MCG/ACT IN AERS
1.0000 | INHALATION_SPRAY | Freq: Four times a day (QID) | RESPIRATORY_TRACT | 0 refills | Status: AC | PRN
  Filled 2021-06-05: qty 18, 25d supply, fill #0

## 2021-06-05 MED ORDER — FLUTICASONE PROPIONATE 50 MCG/ACT NA SUSP
2.0000 | Freq: Every day | NASAL | 12 refills | Status: AC
  Filled 2021-06-05: qty 16, 30d supply, fill #0
  Filled 2021-08-27: qty 16, 30d supply, fill #1
  Filled 2021-10-04: qty 16, 30d supply, fill #2
  Filled 2021-11-16: qty 16, 30d supply, fill #3
  Filled 2021-12-25: qty 16, 30d supply, fill #4
  Filled 2022-02-19 – 2022-02-22 (×2): qty 16, 30d supply, fill #0

## 2021-06-05 MED ORDER — CETIRIZINE HCL 10 MG PO TABS
10.0000 mg | ORAL_TABLET | Freq: Every day | ORAL | 0 refills | Status: DC
  Filled 2021-06-05: qty 90, 90d supply, fill #0

## 2021-06-05 MED ORDER — PREDNISONE 20 MG PO TABS
ORAL_TABLET | ORAL | 0 refills | Status: DC
  Filled 2021-06-05: qty 10, 5d supply, fill #0

## 2021-06-05 NOTE — ED Provider Notes (Signed)
Jennifer Wiggins   MRN: [id number] DOB: 12/05/1970  Subjective:   Jennifer Wiggins is a 50 y.o. female presenting for acute onset this morning of matted right eye, irritated right eye, redness of her eye.  Uses contact lenses.  Has not worn out.  No eye trauma, vision change, eyelid pain or swelling.  Patient is also having 1 year history of persistent chronic cough, wheezing at night.  She was seen in June of this year and prescribed amoxicillin.  She was seen again earlier this month, about 2 weeks ago and was prescribed Augmentin, benzonatate.  Patient smokes 1/3ppd.  No history of asthma or respiratory disorders.  No current facility-administered medications for this encounter.  Current Outpatient Medications:    amoxicillin-clavulanate (AUGMENTIN) 875-125 MG tablet, Take 1 tablet by mouth 2 (two) times daily for 7 days., Disp: 14 tablet, Rfl: 0   benzonatate (TESSALON) 100 MG capsule, Take 2 capsules (200 mg total) by mouth 3 (three) times daily as needed for cough., Disp: 60 capsule, Rfl: 0   celecoxib (CELEBREX) 100 MG capsule, Take 100 mg by mouth 2 (two) times daily., Disp: , Rfl:    cyclobenzaprine (FLEXERIL) 5 MG tablet, TAKE ONE TABLET BY MOUTH AT BEDTIME AS NEEDED FOR MUSCLE PAIN, Disp: 90 tablet, Rfl: 3   DULoxetine (CYMBALTA) 30 MG capsule, Take 30 mg by mouth daily., Disp: , Rfl:    DULoxetine (CYMBALTA) 30 MG capsule, TAKE ONE CAPSULE BY MOUTH ONCE A DAY FOR MOOD, Disp: 90 capsule, Rfl: 3   fluconazole (DIFLUCAN) 150 MG tablet, Take 1 tablet (150 mg total) by mouth daily., Disp: 2 tablet, Rfl: 0   gabapentin (NEURONTIN) 300 MG capsule, Take by mouth., Disp: , Rfl:    gabapentin (NEURONTIN) 300 MG capsule, TAKE 2 CAPSULES BY MOUTH 3 TIMES A DAY. INCREASE DOSE FOR TITRATION., Disp: 180 capsule, Rfl: 3   meloxicam (MOBIC) 15 MG tablet, TAKE ONE TABLET BY MOUTH EVERY DAY FOR PAIN. DO NOT TAKE ON THE SAME DAY AS CELECOXIB., Disp: 90 tablet, Rfl: 2   Misc Natural Products  (TURMERIC CURCUMIN) CAPS, Take 1 capsule by mouth in the morning and at bedtime., Disp: , Rfl:    omeprazole (PRILOSEC) 20 MG capsule, TAKE ONE CAPSULE BY MOUTH ONCE EVERY DAY FOR REFLUX., Disp: 90 capsule, Rfl: 1   tiZANidine (ZANAFLEX) 2 MG tablet, Take 2 mg by mouth 3 (three) times daily., Disp: , Rfl:    tiZANidine (ZANAFLEX) 2 MG tablet, TAKE ONE TABLET BY MOUTH 3 TIMES A DAY FOR MUSCLE SPASTICITY. DO NOT TAKE AT THE SAME TIME AS CYCLOBENZAPRINE, Disp: 90 tablet, Rfl: 3   traZODone (DESYREL) 50 MG tablet, Take 50 mg by mouth at bedtime., Disp: , Rfl:    venlafaxine XR (EFFEXOR-XR) 37.5 MG 24 hr capsule, TAKE ONE CAPSULE BY MOUTH EVERY DAY, Disp: 90 capsule, Rfl: 3   Allergies  Allergen Reactions   Latex Itching and Rash    Past Medical History:  Diagnosis Date   Anxiety    Depression    Uterine fibroid      Past Surgical History:  Procedure Laterality Date   CESAREAN SECTION     TUBAL LIGATION      Family History  Problem Relation Age of Onset   Healthy Mother    Other Father        MVA    Social History   Tobacco Use   Smoking status: Every Day    Packs/day: 0.25    Years: 16.00  Pack years: 4.00    Types: Cigarettes   Smokeless tobacco: Never  Vaping Use   Vaping Use: Never used  Substance Use Topics   Alcohol use: Not Currently   Drug use: Not Currently    ROS   Objective:   Vitals: BP (!) 150/94 (BP Location: Left Wrist)   Pulse 67   Temp 98.3 F (36.8 C) (Oral)   LMP 05/13/2019   SpO2 94%   Physical Exam Constitutional:      General: She is not in acute distress.    Appearance: Normal appearance. She is well-developed. She is not ill-appearing, toxic-appearing or diaphoretic.  HENT:     Head: Normocephalic and atraumatic.     Nose: Nose normal.     Mouth/Throat:     Mouth: Mucous membranes are moist.  Eyes:     Extraocular Movements: Extraocular movements intact.     Pupils: Pupils are equal, round, and reactive to light.   Cardiovascular:     Rate and Rhythm: Normal rate and regular rhythm.     Pulses: Normal pulses.     Heart sounds: Normal heart sounds. No murmur heard.   No friction rub. No gallop.  Pulmonary:     Effort: Pulmonary effort is normal. No respiratory distress.     Breath sounds: No stridor. Wheezing (mild over mid-lower lung fields) present. No rhonchi or rales.  Skin:    General: Skin is warm and dry.     Findings: No rash.  Neurological:     Mental Status: She is alert and oriented to person, place, and time.  Psychiatric:        Mood and Affect: Mood normal.        Behavior: Behavior normal.        Thought Content: Thought content normal.        Judgment: Judgment normal.   Negative chest x-ray, over-read pending.   Assessment and Plan :   PDMP not reviewed this encounter.  1. Acute conjunctivitis of right eye, unspecified acute conjunctivitis type   2. Uses contact lenses   3. Chronic cough   4. Wheezing    Advised against contact lens use for now. Start tobramycin. In light of her smoking, persistent runny and stuffy nose, recommended an oral prednisone course.  Advised that she start a daily regimen of Zyrtec, Flonase and Singulair.  Encouraged her to continue efforts at quitting smoking. X-ray over-read was pending at time of discharge, recommended follow up with only abnormal results. Otherwise will not call for negative over-read. Patient was in agreement. Counseled patient on potential for adverse effects with medications prescribed/recommended today, ER and return-to-clinic precautions discussed, patient verbalized understanding.    Jaynee Eagles, [place] 06/05/21 5916

## 2021-06-05 NOTE — ED Triage Notes (Signed)
Pt was seen 05/22/21 for cough pt state she is not better. Pt also woke up with right eye irritation.

## 2021-06-05 NOTE — Discharge Instructions (Addendum)
I will call you with any abnormal results for your chest x-ray.  Otherwise I want you to start using the albuterol inhaler as needed.  We will use a short round of steroids to help you with your lungs and your sinuses.  Start Zyrtec, Flonase and Singulair daily.  I will hold off on any antibiotic use unless the chest x-ray shows only.  If your chest x-ray comes back normal we will contact you.

## 2021-06-06 ENCOUNTER — Other Ambulatory Visit: Payer: Self-pay

## 2021-06-07 ENCOUNTER — Other Ambulatory Visit: Payer: Self-pay

## 2021-06-08 ENCOUNTER — Other Ambulatory Visit: Payer: Self-pay

## 2021-06-20 ENCOUNTER — Other Ambulatory Visit: Payer: Self-pay

## 2021-06-20 MED FILL — Meloxicam Tab 15 MG: ORAL | 30 days supply | Qty: 30 | Fill #3 | Status: AC

## 2021-06-29 ENCOUNTER — Other Ambulatory Visit: Payer: Self-pay

## 2021-07-27 ENCOUNTER — Other Ambulatory Visit: Payer: Self-pay

## 2021-07-28 IMAGING — CR DG CERVICAL SPINE COMPLETE 4+V
1 series · 6 of 6 positions shown · non-contrast
Comparison: None.

CLINICAL DATA: Cervical radiculopathy.

EXAM:
CERVICAL SPINE - COMPLETE 4+ VIEW

[Series 1: dg cervical spine complete · 0.14mm/px · 6 of 6 slices shown]
[im 1/6]
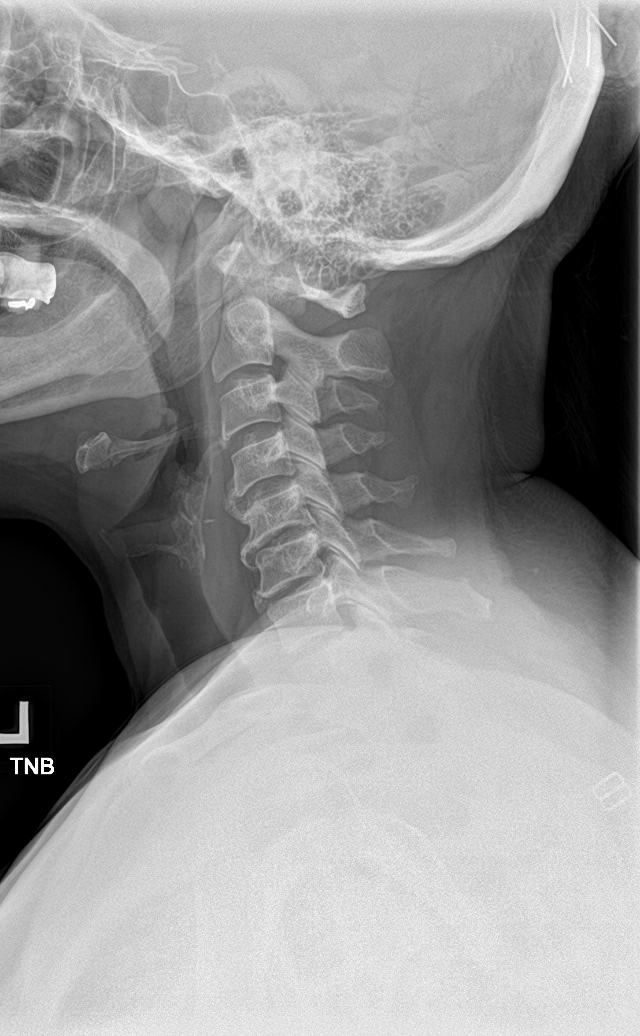
[im 2/6]
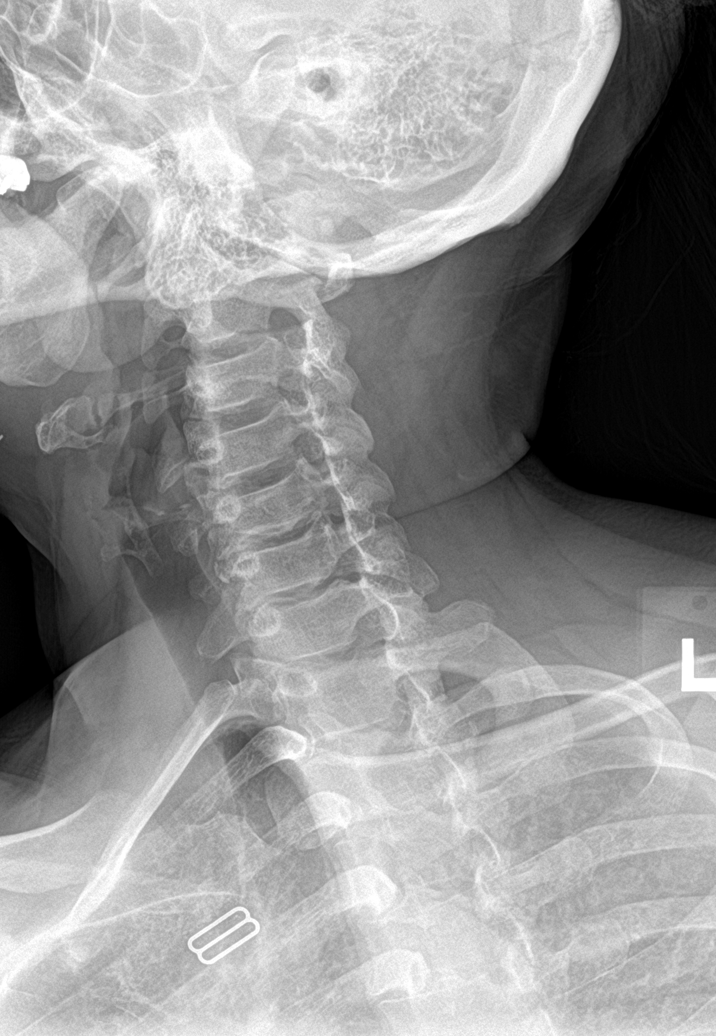
[im 3/6]
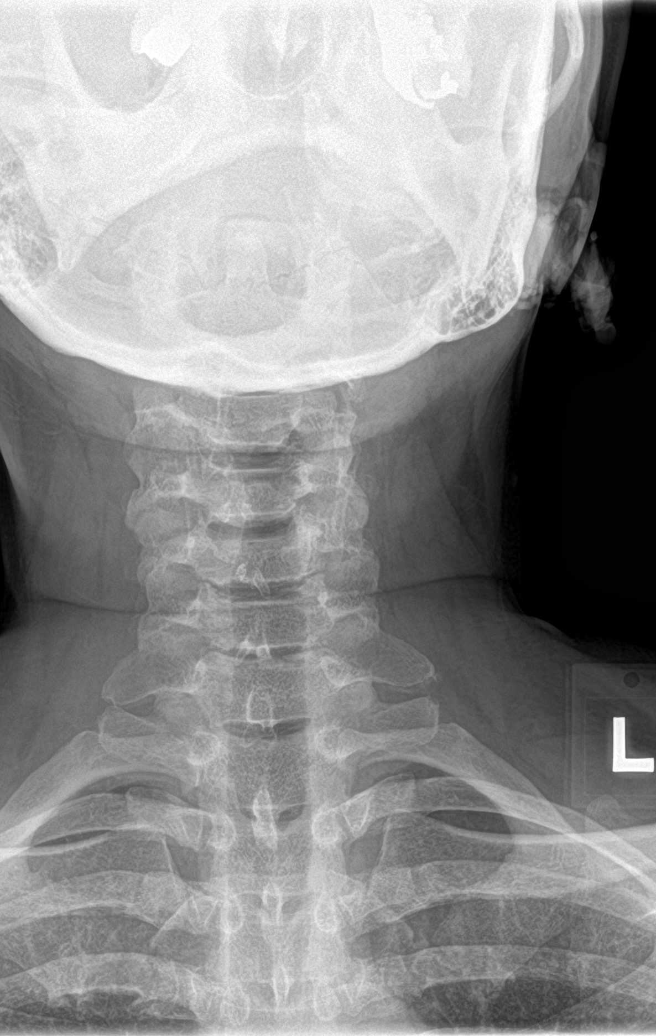
[im 4/6]
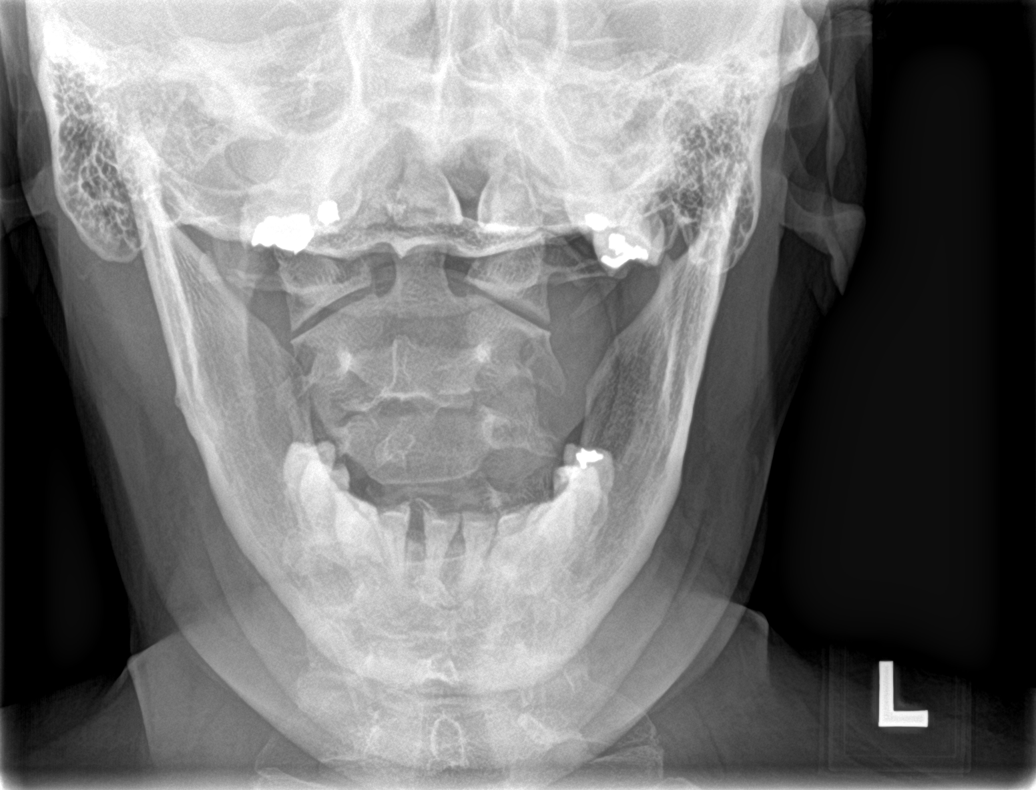
[im 5/6]
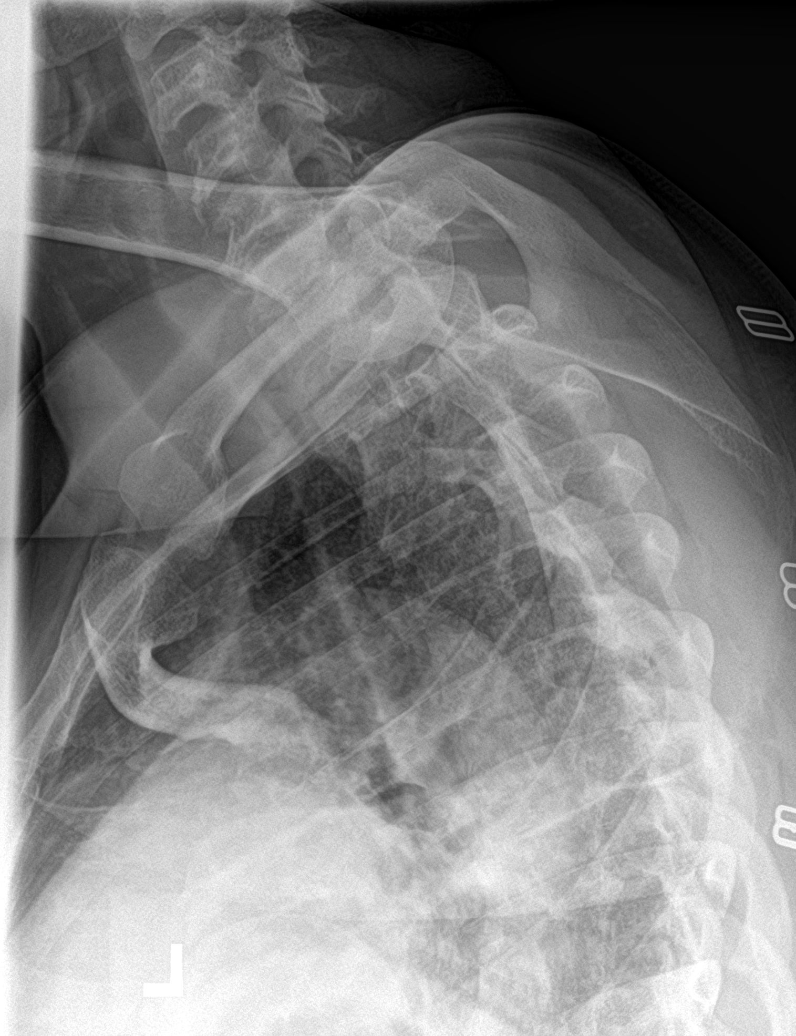
[im 6/6]
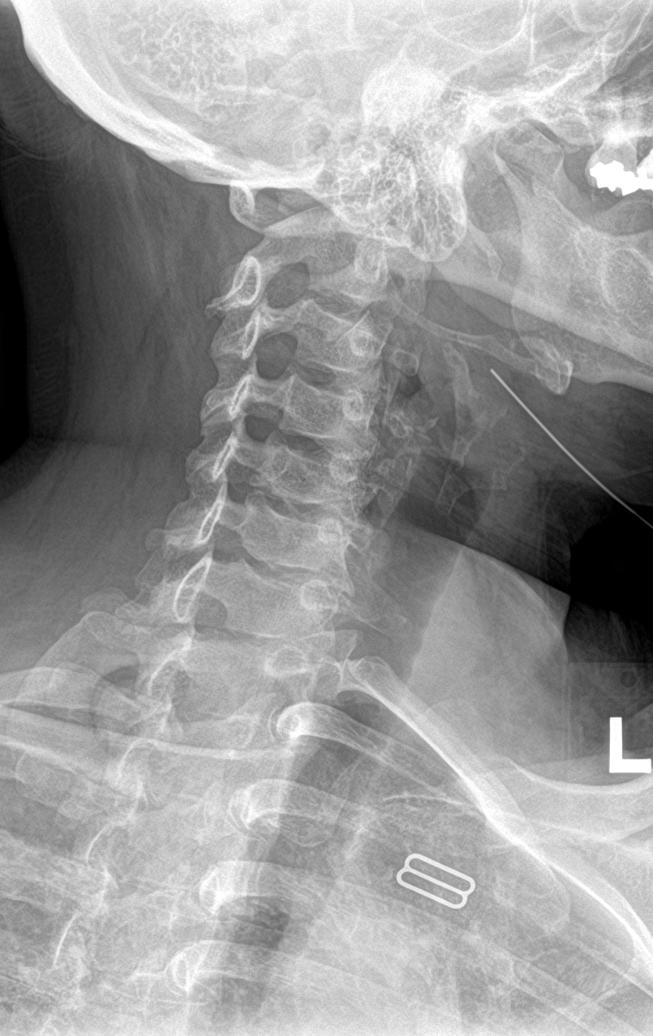

[6 of 6 positions shown; findings below may reference images not displayed]

FINDINGS: There is no prevertebral soft tissue swelling. No acute displaced
fracture. Degenerative changes are noted at the C4-C5, C5-C6, and
C6-C7 levels. There are bridging anterior osteophytes in the lower
cervical segments. There appears to be some mild osseous neural
foraminal narrowing bilaterally in the lower cervical segments.
IMPRESSION: 1. No acute displaced fracture or dislocation.
2. Degenerative changes are noted at C4-C5, C5-C6, and C6-C7 levels.

## 2021-07-30 ENCOUNTER — Other Ambulatory Visit: Payer: Self-pay

## 2021-07-30 MED ORDER — LIDOCAINE 5 % EX OINT
TOPICAL_OINTMENT | CUTANEOUS | 3 refills | Status: DC
  Filled 2021-07-30: qty 35.44, 30d supply, fill #0
  Filled 2021-08-27: qty 35.44, 30d supply, fill #1
  Filled 2021-10-04: qty 35.44, 30d supply, fill #2

## 2021-07-30 MED ORDER — TERBINAFINE HCL 250 MG PO TABS
ORAL_TABLET | ORAL | 0 refills | Status: AC
  Filled 2021-07-30: qty 60, 60d supply, fill #0
  Filled 2021-11-16: qty 24, 24d supply, fill #1

## 2021-07-30 MED ORDER — GUAIFENESIN ER 1200 MG PO TB12: ORAL_TABLET | ORAL | 0 refills | Status: AC

## 2021-07-30 MED ORDER — OMEPRAZOLE 20 MG PO CPDR
DELAYED_RELEASE_CAPSULE | ORAL | 1 refills | Status: DC
  Filled 2021-07-30: qty 90, 90d supply, fill #0
  Filled 2021-08-27: qty 30, 30d supply, fill #0
  Filled 2021-10-04: qty 30, 30d supply, fill #1
  Filled 2021-11-16: qty 30, 30d supply, fill #2

## 2021-07-30 MED ORDER — GABAPENTIN 300 MG PO CAPS
ORAL_CAPSULE | ORAL | 1 refills | Status: DC
  Filled 2021-07-30: qty 90, 30d supply, fill #0
  Filled 2021-08-27: qty 90, 30d supply, fill #1
  Filled 2021-10-04: qty 90, 30d supply, fill #2
  Filled 2021-11-16: qty 90, 30d supply, fill #3

## 2021-07-30 MED ORDER — DULOXETINE HCL 60 MG PO CPEP
ORAL_CAPSULE | ORAL | 1 refills | Status: DC
  Filled 2021-07-30: qty 30, 30d supply, fill #0
  Filled 2021-08-27: qty 30, 30d supply, fill #1
  Filled 2021-10-04: qty 30, 30d supply, fill #2
  Filled 2021-11-16: qty 30, 30d supply, fill #3

## 2021-07-30 MED ORDER — DICLOFENAC SODIUM 1 % EX GEL
CUTANEOUS | 0 refills | Status: DC
  Filled 2021-07-30: qty 100, 4d supply, fill #0

## 2021-07-30 MED ORDER — MELOXICAM 15 MG PO TABS
ORAL_TABLET | ORAL | 1 refills | Status: DC
  Filled 2021-07-30: qty 90, 90d supply, fill #0
  Filled 2021-08-27: qty 30, 30d supply, fill #0
  Filled 2021-10-04: qty 30, 30d supply, fill #1
  Filled 2021-11-16: qty 30, 30d supply, fill #2

## 2021-07-30 MED ORDER — CYCLOBENZAPRINE HCL 5 MG PO TABS
ORAL_TABLET | ORAL | 1 refills | Status: DC
  Filled 2021-07-30: qty 30, 30d supply, fill #0
  Filled 2021-08-27: qty 30, 30d supply, fill #1
  Filled 2021-10-04: qty 30, 30d supply, fill #2
  Filled 2021-11-16: qty 30, 30d supply, fill #3

## 2021-07-30 MED ORDER — CELECOXIB 100 MG PO CAPS
ORAL_CAPSULE | ORAL | 1 refills | Status: DC
  Filled 2021-07-30: qty 60, 30d supply, fill #0
  Filled 2021-08-27: qty 60, 30d supply, fill #1
  Filled 2021-10-04: qty 60, 30d supply, fill #2
  Filled 2021-11-16: qty 60, 30d supply, fill #3

## 2021-07-30 MED ORDER — TRIAMCINOLONE ACETONIDE 0.1 % EX OINT
TOPICAL_OINTMENT | CUTANEOUS | 3 refills | Status: AC
  Filled 2021-07-30: qty 80, 14d supply, fill #0
  Filled 2021-08-27: qty 60, 14d supply, fill #1

## 2021-07-30 MED ORDER — CETIRIZINE HCL 10 MG PO TABS
ORAL_TABLET | ORAL | 2 refills | Status: AC
  Filled 2021-07-30: qty 90, 90d supply, fill #0
  Filled 2021-08-27: qty 60, 60d supply, fill #0
  Filled 2021-11-16: qty 30, 30d supply, fill #1
  Filled 2021-12-25: qty 30, 30d supply, fill #2

## 2021-08-22 ENCOUNTER — Other Ambulatory Visit: Payer: Self-pay

## 2021-08-27 ENCOUNTER — Other Ambulatory Visit: Payer: Self-pay

## 2021-08-27 ENCOUNTER — Other Ambulatory Visit: Payer: Self-pay | Admitting: Urgent Care
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
## 2021-10-04 ENCOUNTER — Other Ambulatory Visit: Payer: Self-pay

## 2021-11-05 ENCOUNTER — Other Ambulatory Visit: Payer: Self-pay

## 2021-11-05 MED ORDER — KETOROLAC TROMETHAMINE 10 MG PO TABS: ORAL_TABLET | ORAL | 0 refills | Status: DC

## 2021-11-13 ENCOUNTER — Other Ambulatory Visit: Payer: Self-pay

## 2021-11-15 ENCOUNTER — Other Ambulatory Visit: Payer: Self-pay

## 2021-11-16 ENCOUNTER — Other Ambulatory Visit: Payer: Self-pay

## 2021-12-10 ENCOUNTER — Other Ambulatory Visit: Payer: Self-pay

## 2021-12-10 MED ORDER — CYCLOBENZAPRINE HCL 5 MG PO TABS
5.0000 mg | ORAL_TABLET | Freq: Every evening | ORAL | 3 refills | Status: AC | PRN
  Filled 2022-02-19: qty 90, 90d supply, fill #0
  Filled 2022-02-22: qty 30, 30d supply, fill #0

## 2021-12-10 MED ORDER — DICLOFENAC SODIUM 1 % EX GEL
2.0000 g | CUTANEOUS | 3 refills | Status: AC
  Filled 2021-12-10: qty 100, 3d supply, fill #0

## 2021-12-10 MED ORDER — OMEPRAZOLE 20 MG PO CPDR
DELAYED_RELEASE_CAPSULE | ORAL | 3 refills | Status: AC
  Filled 2021-12-10: qty 90, 90d supply, fill #0

## 2021-12-10 MED ORDER — CELECOXIB 100 MG PO CAPS
100.0000 mg | ORAL_CAPSULE | Freq: Two times a day (BID) | ORAL | 3 refills | Status: AC
  Filled 2021-12-25 – 2022-02-22 (×3): qty 60, 30d supply, fill #0

## 2021-12-10 MED ORDER — MELOXICAM 15 MG PO TABS
ORAL_TABLET | ORAL | 3 refills | Status: AC
  Filled 2021-12-25 – 2022-02-22 (×3): qty 30, 30d supply, fill #0

## 2021-12-10 MED ORDER — DULOXETINE HCL 60 MG PO CPEP
60.0000 mg | ORAL_CAPSULE | Freq: Every day | ORAL | 3 refills | Status: AC
  Filled 2021-12-25 – 2022-02-22 (×3): qty 30, 30d supply, fill #0

## 2021-12-10 MED ORDER — GABAPENTIN 300 MG PO CAPS
300.0000 mg | ORAL_CAPSULE | Freq: Three times a day (TID) | ORAL | 3 refills | Status: AC
  Filled 2022-02-19: qty 270, 90d supply, fill #0
  Filled 2022-02-22: qty 90, 30d supply, fill #0

## 2021-12-11 ENCOUNTER — Other Ambulatory Visit: Payer: Self-pay

## 2021-12-13 ENCOUNTER — Other Ambulatory Visit: Payer: Self-pay

## 2021-12-13 MED ORDER — BENZONATATE 100 MG PO CAPS
ORAL_CAPSULE | ORAL | 0 refills | Status: AC
  Filled 2021-12-13: qty 30, 10d supply, fill #0

## 2021-12-14 ENCOUNTER — Other Ambulatory Visit: Payer: Self-pay

## 2021-12-17 ENCOUNTER — Other Ambulatory Visit: Payer: Self-pay

## 2021-12-19 ENCOUNTER — Other Ambulatory Visit: Payer: Self-pay

## 2021-12-24 ENCOUNTER — Other Ambulatory Visit: Payer: Self-pay

## 2021-12-25 ENCOUNTER — Other Ambulatory Visit: Payer: Self-pay

## 2021-12-25 MED ORDER — KETOROLAC TROMETHAMINE 10 MG PO TABS: ORAL_TABLET | ORAL | 0 refills | Status: AC

## 2022-01-04 ENCOUNTER — Other Ambulatory Visit: Payer: Self-pay

## 2022-02-07 ENCOUNTER — Other Ambulatory Visit: Payer: Self-pay

## 2022-02-19 ENCOUNTER — Other Ambulatory Visit: Payer: Self-pay

## 2022-02-19 MED ORDER — OMEPRAZOLE 20 MG PO CPDR
DELAYED_RELEASE_CAPSULE | ORAL | 1 refills | Status: AC
  Filled 2022-02-19: qty 90, 90d supply, fill #0
  Filled 2022-02-22: qty 30, 30d supply, fill #0

## 2022-02-20 ENCOUNTER — Other Ambulatory Visit: Payer: Self-pay

## 2022-02-21 ENCOUNTER — Other Ambulatory Visit: Payer: Self-pay

## 2022-02-22 ENCOUNTER — Other Ambulatory Visit: Payer: Self-pay

## 2022-02-24 ENCOUNTER — Emergency Department: Payer: No Typology Code available for payment source

## 2022-02-24 ENCOUNTER — Emergency Department
Admission: EM | Admit: 2022-02-24 | Discharge: 2022-02-24 | Disposition: A | Discharge status: Home / Self Care | Payer: No Typology Code available for payment source | Attending: Emergency Medicine | Admitting: Emergency Medicine

## 2022-02-24 ENCOUNTER — Other Ambulatory Visit: Payer: Self-pay

## 2022-02-24 DIAGNOSIS — W1839XA Other fall on same level, initial encounter: Secondary | ICD-10-CM | POA: Insufficient documentation

## 2022-02-24 DIAGNOSIS — Y92832 Beach as the place of occurrence of the external cause: Secondary | ICD-10-CM | POA: Insufficient documentation

## 2022-02-24 DIAGNOSIS — S63502A Unspecified sprain of left wrist, initial encounter: Secondary | ICD-10-CM
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
## 2022-02-24 NOTE — ED Notes (Signed)
Dr bradler in lobby and taking pt to examine in triage.  Ambulatory without distress.

## 2022-02-24 NOTE — ED Triage Notes (Signed)
Pt presents to ER from home c/o left wrist pain that has been going on for a very long time (pt unsure of timeline).  Pt states she was seen by doctor on Thursday, and he pressed on something on her wrist, and her wrist has been hurting worse since then.  Pt denies recent injury to wrist.  No deformity noted.  Pt is A&O x4 at this time in NAD in triage.

## 2022-05-02 ENCOUNTER — Other Ambulatory Visit: Payer: Self-pay

## 2022-06-26 ENCOUNTER — Other Ambulatory Visit: Payer: Self-pay

## 2022-06-26 ENCOUNTER — Emergency Department
Admission: EM | Admit: 2022-06-26 | Discharge: 2022-06-26 | Disposition: A | Discharge status: Home / Self Care | Payer: No Typology Code available for payment source | Attending: Emergency Medicine | Admitting: Emergency Medicine

## 2022-06-26 ENCOUNTER — Encounter: Payer: Self-pay | Admitting: Emergency Medicine

## 2022-06-26 DIAGNOSIS — S39012A Strain of muscle, fascia and tendon of lower back, initial encounter: Secondary | ICD-10-CM | POA: Insufficient documentation

## 2022-06-26 DIAGNOSIS — X500XXA Overexertion from strenuous movement or load, initial encounter: Secondary | ICD-10-CM | POA: Insufficient documentation

## 2022-06-26 MED ORDER — HYDROCODONE-ACETAMINOPHEN 5-325 MG PO TABS: 2.0000 | ORAL_TABLET | Freq: Four times a day (QID) | ORAL | 0 refills | Status: AC | PRN

## 2022-06-26 MED ORDER — LIDOCAINE 5 % EX PTCH
1.0000 | MEDICATED_PATCH | Freq: Once | CUTANEOUS | Status: DC
  Administered 2022-06-26: 1 via TRANSDERMAL
  Filled 2022-06-26: qty 1

## 2022-06-26 MED ORDER — KETOROLAC TROMETHAMINE 30 MG/ML IJ SOLN
60.0000 mg | Freq: Once | INTRAMUSCULAR | Status: AC
  Administered 2022-06-26: 60 mg via INTRAMUSCULAR
  Filled 2022-06-26: qty 2

## 2022-06-26 MED ORDER — LIDOCAINE 5 % EX PTCH: 1.0000 | MEDICATED_PATCH | CUTANEOUS | 0 refills | Status: AC

## 2022-06-26 MED ORDER — ONDANSETRON 4 MG PO TBDP: 4.0000 mg | ORAL_TABLET | Freq: Four times a day (QID) | ORAL | 0 refills | Status: AC | PRN

## 2022-06-26 NOTE — ED Provider Notes (Signed)
[facility] Provider Note    Event Date/Time   First MD Initiated Contact with Patient 06/26/22 0254     (approximate)   History   Back Pain   HPI  Jennifer Wiggins is a 51 y.o. female with history of anxiety, depression who presents to the emergency department with right lower back pain.  Symptoms started after she was lifting heavy things and moving them out of a storage unit today.  She states like she pulled a muscle in her back.  Reports she has meloxicam and gabapentin at home that was not providing much relief.  Denies numbness, tingling, weakness, bowel or bladder incontinence, urinary retention, fever.  No previous back surgery or epidural injection.  Able to ambulate.  Drove herself to the emergency department.   History provided by patient.    Past Medical History:  Diagnosis Date   Anxiety    Depression    Uterine fibroid     Past Surgical History:  Procedure Laterality Date   CESAREAN SECTION     TUBAL LIGATION      MEDICATIONS:  Prior to Admission medications   Medication Sig Start Date End Date Taking? Authorizing Provider  albuterol (VENTOLIN HFA) 108 (90 Base) MCG/ACT inhaler Inhale 1-2 puffs into the lungs every 6 (six) hours as needed for wheezing or shortness of breath. 06/05/21   Jaynee Eagles, PA-C  benzonatate (TESSALON) 100 MG capsule Take 1 capsule by mouth 3 times a day as needed for cough. 12/13/21     celecoxib (CELEBREX) 100 MG capsule Take 100 mg by mouth 2 (two) times daily.    [provider]  celecoxib (CELEBREX) 100 MG capsule Take 1 capsule (100 mg total) by mouth 2 (two) times daily for arthritis. (Do not take at the same time as Meloxicam). 12/10/21     cetirizine (ZYRTEC) 10 MG tablet TAKE ONE TABLET BY MOUTH ONCE DAILY AS NEEDED FOR ALLERGIES. 07/30/21     cyclobenzaprine (FLEXERIL) 5 MG tablet Take 1 tablet (5 mg total) by mouth once nightly at bedtime as needed for muscle pain/spasms. 12/10/21     diclofenac  Sodium (VOLTAREN) 1 % GEL Apply to affected area 4 times daily as needed. Apply 2 g to upper extremity and 4 g to lower extremity. Do not exceed 8 g per upper extremity joint or 16 g for lower extremity joint per day. Max total body dose is 32 g per day. 12/10/21     DULoxetine (CYMBALTA) 30 MG capsule Take 30 mg by mouth daily.    [provider]  DULoxetine (CYMBALTA) 60 MG capsule Take 1 capsule (60 mg total) by mouth once daily for mood. 12/10/21     fluconazole (DIFLUCAN) 150 MG tablet Take 1 tablet (150 mg total) by mouth daily. 09/12/20   Rodriguez-Southworth, Sunday Spillers, PA-C  fluticasone (FLONASE) 50 MCG/ACT nasal spray Place 2 sprays into both nostrils once daily. 06/05/21   Jaynee Eagles, PA-C  gabapentin (NEURONTIN) 300 MG capsule Take by mouth. 01/14/20 05/22/21  [provider]  gabapentin (NEURONTIN) 300 MG capsule Take 1 capsule (300 mg total) by mouth 3 (three) times daily for pain. 12/10/21     Guaifenesin 1200 MG TB12 TAKE 1 TABLET BY MOUTH ONCE EVERY 12 HOURS FOR COUGH AND CHEST CONGESTION. 07/30/21     ketorolac (TORADOL) 10 MG tablet Take 1 tablet (10 mg total) by mouth once every six (6) hours as needed for pain for up to 5 days. 12/25/21  lidocaine (XYLOCAINE) 5 % ointment APPLY A SMALL AMOUNT TOPICALLY TO PAINFUL AREA 3 TIMES A DAY AS NEEDED FOR PAIN. 07/30/21     meloxicam (MOBIC) 15 MG tablet Take 1 tablet by mouth once a day for pain. (As discussed, do not take on the same day that you take celecoxib). 12/10/21     Misc Natural Products (TURMERIC CURCUMIN) CAPS Take 1 capsule by mouth in the morning and at bedtime.    [provider]  montelukast (SINGULAIR) 10 MG tablet Take 1 tablet (10 mg total) by mouth once daily at bedtime. 06/05/21   Jaynee Eagles, PA-C  omeprazole (PRILOSEC) 20 MG capsule TAKE 1 CAPSULE BY MOUTH ONCE DAILY 12/10/21     omeprazole (PRILOSEC) 20 MG capsule TAKE ONE CAPSULE BY MOUTH ONCE EVERY DAY FOR REFLUX. 02/19/22     terbinafine (LAMISIL)  250 MG tablet TAKE 1 TABLET BY MOUTH ONCE DAILY FOR NAIL FUNGUS FOR 12 WEEKS. 07/30/21     tiZANidine (ZANAFLEX) 2 MG tablet Take 2 mg by mouth 3 (three) times daily.    [provider]  tobramycin (TOBREX) 0.3 % ophthalmic solution Place 1 drop into the right eye once every 4 (four) hours for 7 days. 06/05/21   Jaynee Eagles, PA-C  traZODone (DESYREL) 50 MG tablet Take 50 mg by mouth at bedtime.    [provider]  triamcinolone ointment (KENALOG) 0.1 % APPLY A SMALL AMOUNT TO THE AFFECTED AREA(S) TWICE DAILY AS NEEDED FOR NO MORE THAN 2 WEEKS. 07/30/21     venlafaxine XR (EFFEXOR-XR) 37.5 MG 24 hr capsule TAKE ONE CAPSULE BY MOUTH EVERY DAY 05/22/20 05/22/21    buPROPion (WELLBUTRIN SR) 150 MG 12 hr tablet Take 150 mg by mouth 2 (two) times daily. Patient not taking: Reported on 06/15/2020  08/29/20  [provider]  nortriptyline (PAMELOR) 10 MG capsule TAKE 1 TO 2 CAPSULES BY MOUTH 2 HOURS BEFORE BEDTIME 04/13/20 04/20/21  Pasi, Alleen Borne, MD  ranitidine (ZANTAC) 75 MG tablet Take 75 mg by mouth 2 (two) times daily. Patient not taking: Reported on 06/15/2020  08/29/20  [provider]  sertraline (ZOLOFT) 25 MG tablet Take 25 mg by mouth daily. Patient not taking: Reported on 06/15/2020  08/29/20  [provider]    Physical Exam   Triage Vital Signs: ED Triage Vitals  Enc Vitals Group     BP 06/26/22 0251 (!) 142/92     Pulse Rate 06/26/22 0251 81     Resp 06/26/22 0251 18     Temp 06/26/22 0251 97.6 F (36.4 C)     Temp Source 06/26/22 0251 Oral     SpO2 06/26/22 0251 100 %     Weight 06/26/22 0250 170 lb (77.1 kg)     Height 06/26/22 0250 '5\' 5"'$  (1.651 m)     Head Circumference --      Peak Flow --      Pain Score 06/26/22 0249 10     Pain Loc --      Pain Edu? --      Excl. in [place]? --     Most recent vital signs: Vitals:   06/26/22 0251  BP: (!) 142/92  Pulse: 81  Resp: 18  Temp: 97.6 F (36.4 C)  SpO2: 100%    CONSTITUTIONAL: Alert  and oriented and responds appropriately to questions.  Appears uncomfortable but not in distress.  Afebrile. HEAD: Normocephalic, atraumatic EYES: Conjunctivae clear, pupils appear equal, sclera nonicteric ENT: normal nose; moist mucous  membranes NECK: Supple, normal ROM CARD: RRR; S1 and S2 appreciated; no murmurs, no clicks, no rubs, no gallops RESP: Normal chest excursion without splinting or tachypnea; breath sounds clear and equal bilaterally; no wheezes, no rhonchi, no rales, no hypoxia or respiratory distress, speaking full sentences ABD/GI: Normal bowel sounds; non-distended; soft, non-tender, no rebound, no guarding, no peritoneal signs BACK: The back appears normal, tender to palpation over the right paraspinal lumbar musculature, no midline spinal tenderness or step-off or deformity, no redness or warmth, ecchymosis, soft tissue swelling noted, no rash or other lesions EXT: Normal ROM in all joints; no deformity noted, no edema; no cyanosis SKIN: Normal color for age and race; warm; no rash on exposed skin NEURO: Moves all extremities equally, normal speech, normal sensation diffusely, ambulates with slow and steady gait, no hyperreflexia PSYCH: The patient's mood and manner are appropriate.   ED Results / Procedures / Treatments   LABS: (all labs ordered are listed, but only abnormal results are displayed) Labs Reviewed - No data to display   EKG:  RADIOLOGY: My personal review and interpretation of imaging:    I have personally reviewed all radiology reports.   No results found.   PROCEDURES:  Critical Care performed: No     Procedures    IMPRESSION / MDM / ASSESSMENT AND PLAN / ED COURSE  I reviewed the triage vital signs and the nursing notes.    Patient here with lower back strain.  No focal neurologic deficits.  No red flag symptoms.     DIFFERENTIAL DIAGNOSIS (includes but not limited to):   Lumbar strain, muscle spasm, doubt cauda equina,  epidural abscess or hematoma, discitis or osteomyelitis, transverse myelitis, fracture   Patient's presentation is most consistent with acute, uncomplicated illness.   PLAN: Patient here with lumbar strain.  She states she drove herself to the emergency department.  Will avoid any sedatives here today but will discharge with short course of analgesia for home.  Advised her to continue gabapentin, meloxicam as prescribed.  We will give IM Toradol here and Lidoderm patch.  We will give outpatient follow-up information.  No indication for emergent imaging at this time.   MEDICATIONS GIVEN IN ED: Medications  ketorolac (TORADOL) 30 MG/ML injection 60 mg (has no administration in time range)  lidocaine (LIDODERM) 5 % 1 patch (has no administration in time range)     ED COURSE:  At this time, I do not feel there is any life-threatening condition present. I reviewed all nursing notes, vitals, pertinent previous records.  All lab and urine results, EKGs, imaging ordered have been independently reviewed and interpreted by myself.  I reviewed all available radiology reports from any imaging ordered this visit.  Based on my assessment, I feel the patient is safe to be discharged home without further emergent workup and can continue workup as an outpatient as needed. Discussed all findings, treatment plan as well as usual and customary return precautions.  They verbalize understanding and are comfortable with this plan.  Outpatient follow-up has been provided as needed.  All questions have been answered.    CONSULTS:  none   OUTSIDE RECORDS REVIEWED: Reviewed last family medicine visit on 06/12/2022.       FINAL CLINICAL IMPRESSION(S) / ED DIAGNOSES   Final diagnoses:  Strain of lumbar region, initial encounter     Rx / DC Orders   ED Discharge Orders          Ordered    HYDROcodone-acetaminophen (NORCO/VICODIN)  5-325 MG tablet  Every 6 hours PRN        06/26/22 0334    ondansetron  (ZOFRAN-ODT) 4 MG disintegrating tablet  Every 6 hours PRN        06/26/22 0334    lidocaine (LIDODERM) 5 %  Every 24 hours        06/26/22 0334             Note:  This document was prepared using Dragon voice recognition software and may include unintentional dictation errors.   Kanitra Purifoy, Delice Bison, DO 06/26/22 [phone]

## 2022-06-26 NOTE — ED Triage Notes (Signed)
Pt arrived via POV with reports of moving things out of storage building since 430 this evening, pt states she pulled a muscle in her lower back.  Pt c/o pain with walking.

## 2022-06-26 NOTE — Discharge Instructions (Addendum)
You may alternate heat and ice to your back.  I recommend stretching, gentle massage and no lifting over 10 pounds for the next week.   You are being provided a prescription for opiates (also known as narcotics) for pain control.  Opiates can be addictive and should only be used when absolutely necessary for pain control when other alternatives do not work.  We recommend you only use them for the recommended amount of time and only as prescribed.  Please do not take with other sedative medications or alcohol.  Please do not drive, operate machinery, make important decisions while taking opiates.  Please note that these medications can be addictive and have high abuse potential.  Patients can become addicted to narcotics after only taking them for a few days.  Please keep these medications locked away from children, teenagers or any family members with history of substance abuse.  Narcotic pain medicine may also make you constipated.  You may use over-the-counter medications such as MiraLAX, Colace to prevent constipation.  If you become constipated, you may use over-the-counter enemas as needed.  Itching and nausea are also common side effects of narcotic pain medication.  If you develop uncontrolled vomiting or a rash, please stop these medications and seek medical care.   Please continue your meloxicam and gabapentin as prescribed.

## 2022-08-21 DIAGNOSIS — M654 Radial styloid tenosynovitis [de Quervain]: Secondary | ICD-10-CM | POA: Diagnosis not present

## 2022-08-29 DIAGNOSIS — M65841 Other synovitis and tenosynovitis, right hand: Secondary | ICD-10-CM | POA: Diagnosis not present

## 2022-08-29 DIAGNOSIS — Z79899 Other long term (current) drug therapy: Secondary | ICD-10-CM | POA: Diagnosis not present

## 2022-08-29 DIAGNOSIS — M25531 Pain in right wrist: Secondary | ICD-10-CM | POA: Diagnosis not present

## 2022-08-29 DIAGNOSIS — M25532 Pain in left wrist: Secondary | ICD-10-CM | POA: Diagnosis not present

## 2022-08-29 DIAGNOSIS — M654 Radial styloid tenosynovitis [de Quervain]: Secondary | ICD-10-CM | POA: Diagnosis not present

## 2022-09-05 DIAGNOSIS — Z1211 Encounter for screening for malignant neoplasm of colon: Secondary | ICD-10-CM | POA: Diagnosis not present

## 2022-09-05 DIAGNOSIS — K648 Other hemorrhoids: Secondary | ICD-10-CM | POA: Diagnosis not present

## 2022-09-11 DIAGNOSIS — M654 Radial styloid tenosynovitis [de Quervain]: Secondary | ICD-10-CM | POA: Diagnosis not present

## 2022-09-19 DIAGNOSIS — M5412 Radiculopathy, cervical region: Secondary | ICD-10-CM | POA: Diagnosis not present

## 2022-09-19 DIAGNOSIS — K047 Periapical abscess without sinus: Secondary | ICD-10-CM | POA: Diagnosis not present

## 2022-09-19 DIAGNOSIS — F17210 Nicotine dependence, cigarettes, uncomplicated: Secondary | ICD-10-CM | POA: Diagnosis not present

## 2022-09-19 DIAGNOSIS — R519 Headache, unspecified: Secondary | ICD-10-CM | POA: Diagnosis not present

## 2022-09-19 DIAGNOSIS — M62830 Muscle spasm of back: Secondary | ICD-10-CM | POA: Diagnosis not present

## 2022-09-19 DIAGNOSIS — Z79899 Other long term (current) drug therapy: Secondary | ICD-10-CM | POA: Diagnosis not present

## 2022-09-19 DIAGNOSIS — K029 Dental caries, unspecified: Secondary | ICD-10-CM | POA: Diagnosis not present

## 2022-09-19 DIAGNOSIS — M9901 Segmental and somatic dysfunction of cervical region: Secondary | ICD-10-CM | POA: Diagnosis not present

## 2022-11-14 DIAGNOSIS — R519 Headache, unspecified: Secondary | ICD-10-CM | POA: Diagnosis not present

## 2022-11-14 DIAGNOSIS — M62830 Muscle spasm of back: Secondary | ICD-10-CM | POA: Diagnosis not present

## 2022-11-14 DIAGNOSIS — M5412 Radiculopathy, cervical region: Secondary | ICD-10-CM | POA: Diagnosis not present

## 2022-11-14 DIAGNOSIS — M9901 Segmental and somatic dysfunction of cervical region: Secondary | ICD-10-CM | POA: Diagnosis not present

## 2022-12-03 DIAGNOSIS — M62830 Muscle spasm of back: Secondary | ICD-10-CM | POA: Diagnosis not present

## 2022-12-03 DIAGNOSIS — M9901 Segmental and somatic dysfunction of cervical region: Secondary | ICD-10-CM | POA: Diagnosis not present

## 2022-12-03 DIAGNOSIS — R519 Headache, unspecified: Secondary | ICD-10-CM | POA: Diagnosis not present

## 2022-12-03 DIAGNOSIS — M5412 Radiculopathy, cervical region: Secondary | ICD-10-CM | POA: Diagnosis not present

## 2023-02-20 DIAGNOSIS — M5412 Radiculopathy, cervical region: Secondary | ICD-10-CM | POA: Diagnosis not present

## 2023-02-20 DIAGNOSIS — M62830 Muscle spasm of back: Secondary | ICD-10-CM | POA: Diagnosis not present

## 2023-02-20 DIAGNOSIS — R519 Headache, unspecified: Secondary | ICD-10-CM | POA: Diagnosis not present

## 2023-02-20 DIAGNOSIS — M9901 Segmental and somatic dysfunction of cervical region: Secondary | ICD-10-CM | POA: Diagnosis not present

## 2023-04-28 ENCOUNTER — Other Ambulatory Visit: Payer: Self-pay
